# Patient Record
Sex: Male | Born: 1955 | Race: White | Hispanic: No | Marital: Married | Smoking: Never smoker
Health system: Southern US, Community
[De-identification: ages and names within clinical notes are randomized; demographics above are authoritative.]

## PROBLEM LIST (undated history)

## (undated) DIAGNOSIS — I10 Essential (primary) hypertension: Secondary | ICD-10-CM

## (undated) DIAGNOSIS — M1990 Unspecified osteoarthritis, unspecified site: Secondary | ICD-10-CM

## (undated) DIAGNOSIS — E785 Hyperlipidemia, unspecified: Secondary | ICD-10-CM

## (undated) DIAGNOSIS — K219 Gastro-esophageal reflux disease without esophagitis: Secondary | ICD-10-CM

## (undated) DIAGNOSIS — F419 Anxiety disorder, unspecified: Secondary | ICD-10-CM

## (undated) DIAGNOSIS — F32A Depression, unspecified: Secondary | ICD-10-CM

## (undated) DIAGNOSIS — F329 Major depressive disorder, single episode, unspecified: Secondary | ICD-10-CM

## (undated) DIAGNOSIS — G4730 Sleep apnea, unspecified: Secondary | ICD-10-CM

## (undated) DIAGNOSIS — G500 Trigeminal neuralgia: Secondary | ICD-10-CM

## (undated) DIAGNOSIS — C801 Malignant (primary) neoplasm, unspecified: Secondary | ICD-10-CM

## (undated) HISTORY — PX: SURGERY SCROTAL / TESTICULAR: SUR1316

## (undated) HISTORY — PX: COLON SURGERY: SHX602

## (undated) HISTORY — PX: BACK SURGERY: SHX140

## (undated) HISTORY — PX: BRAIN SURGERY: SHX531

## (undated) HISTORY — PX: CERVICAL FUSION: SHX112

---

## 2004-12-25 ENCOUNTER — Emergency Department (HOSPITAL_COMMUNITY): Admission: EM | Admit: 2004-12-25 | Discharge: 2004-12-25 | Payer: Self-pay | Admitting: Emergency Medicine

## 2008-01-08 ENCOUNTER — Emergency Department (HOSPITAL_BASED_OUTPATIENT_CLINIC_OR_DEPARTMENT_OTHER): Admission: EM | Admit: 2008-01-08 | Discharge: 2008-01-08 | Payer: Self-pay | Admitting: Emergency Medicine

## 2008-01-24 ENCOUNTER — Emergency Department (HOSPITAL_BASED_OUTPATIENT_CLINIC_OR_DEPARTMENT_OTHER): Admission: EM | Admit: 2008-01-24 | Discharge: 2008-01-24 | Payer: Self-pay | Admitting: Emergency Medicine

## 2008-06-21 ENCOUNTER — Emergency Department (HOSPITAL_BASED_OUTPATIENT_CLINIC_OR_DEPARTMENT_OTHER): Admission: EM | Admit: 2008-06-21 | Discharge: 2008-06-21 | Payer: Self-pay | Admitting: Emergency Medicine

## 2009-06-18 ENCOUNTER — Emergency Department (HOSPITAL_BASED_OUTPATIENT_CLINIC_OR_DEPARTMENT_OTHER): Admission: EM | Admit: 2009-06-18 | Discharge: 2009-06-18 | Payer: Self-pay | Admitting: Emergency Medicine

## 2009-07-10 ENCOUNTER — Emergency Department (HOSPITAL_BASED_OUTPATIENT_CLINIC_OR_DEPARTMENT_OTHER): Admission: EM | Admit: 2009-07-10 | Discharge: 2009-07-11 | Payer: Self-pay | Admitting: Emergency Medicine

## 2010-08-29 LAB — COMPREHENSIVE METABOLIC PANEL
AST: 26 U/L (ref 0–37)
BUN: 25 mg/dL — ABNORMAL HIGH (ref 6–23)
CO2: 24 mEq/L (ref 19–32)
Calcium: 10.4 mg/dL (ref 8.4–10.5)
Chloride: 97 mEq/L (ref 96–112)
Creatinine, Ser: 1.5 mg/dL (ref 0.4–1.5)
GFR calc Af Amer: 59 mL/min — ABNORMAL LOW (ref >60)
Glucose, Bld: 117 mg/dL — ABNORMAL HIGH (ref 70–99)
Sodium: 138 mEq/L (ref 135–145)

## 2010-08-29 LAB — DIFFERENTIAL
Basophils Absolute: 0.2 10*3/uL — ABNORMAL HIGH (ref 0.0–0.1)
Basophils Relative: 2 % — ABNORMAL HIGH (ref 0–1)
Eosinophils Absolute: 0.1 10*3/uL (ref 0.0–0.7)
Lymphocytes Relative: 4 % — ABNORMAL LOW (ref 12–46)
Monocytes Absolute: 0.5 10*3/uL (ref 0.1–1.0)
Monocytes Relative: 5 % (ref 3–12)

## 2010-08-29 LAB — URINALYSIS, ROUTINE W REFLEX MICROSCOPIC
Ketones, ur: NEGATIVE mg/dL
Leukocytes, UA: NEGATIVE
Protein, ur: >300 mg/dL — AB
Specific Gravity, Urine: 1.015 (ref 1.005–1.030)
Urobilinogen, UA: 0.2 mg/dL (ref 0.0–1.0)

## 2010-08-29 LAB — URINE MICROSCOPIC-ADD ON

## 2010-08-29 LAB — CBC
HCT: 42.3 % (ref 39.0–52.0)
MCHC: 34.6 g/dL (ref 30.0–36.0)
Platelets: 300 10*3/uL (ref 150–400)
RDW: 12.3 % (ref 11.5–15.5)

## 2012-01-07 ENCOUNTER — Encounter (HOSPITAL_BASED_OUTPATIENT_CLINIC_OR_DEPARTMENT_OTHER): Payer: Self-pay | Admitting: *Deleted

## 2012-01-07 ENCOUNTER — Emergency Department (HOSPITAL_BASED_OUTPATIENT_CLINIC_OR_DEPARTMENT_OTHER)
Admission: EM | Admit: 2012-01-07 | Discharge: 2012-01-07 | Disposition: A | Discharge status: Home / Self Care | Payer: Medicare Other | Attending: Emergency Medicine | Admitting: Emergency Medicine

## 2012-01-07 DIAGNOSIS — I10 Essential (primary) hypertension: Secondary | ICD-10-CM | POA: Insufficient documentation

## 2012-01-07 HISTORY — DX: Trigeminal neuralgia: G50.0

## 2012-01-07 NOTE — ED Notes (Signed)
In to d/c pt, pt stated "well i didn't even have to see a doctor"-advised pt that PAs and NPs see pt in the ED and discuss case/care with EDP-pt stated "bet his name will be on the bill"-offered pt to speak with EDP Sheldon-pt refused

## 2012-01-07 NOTE — ED Provider Notes (Signed)
History     CSN: [id number]  Arrival date & time 01/07/12  1725   First MD Initiated Contact with Patient 01/07/12 1739      Chief Complaint  Patient presents with  . Hypertension    (Consider location/radiation/quality/duration/timing/severity/associated sxs/prior treatment) Patient is a 56 y.o. male presenting with hypertension. The history is provided by the patient.  Hypertension This is a new problem. Pertinent negatives include no abdominal pain, chest pain, fever or nausea. Associated symptoms comments: He has had recent major surgery that required a pre-operative cardiac evaluation secondary to high blood pressure. He denies known history of hypertension. He reports cardiac studies, including a cardiolyte stress test were normal, and no bleed pressure medications were prescribed. He is now one month out from surgery and found his blood pressure to be elevated on multiple checks. He was seen this morning at Urgent Care and started on HCTZ. He has had one dose earlier today and presents now because there is no change in blood pressure. No chest pain, SOB, nausea, peripheral edema or visual problems. .    Past Medical History  Diagnosis Date  . Trigeminus neuralgia     History reviewed. No pertinent past surgical history.  History reviewed. No pertinent family history.  History  Substance Use Topics  . Smoking status: Not on file  . Smokeless tobacco: Not on file  . Alcohol Use:       Review of Systems  Constitutional: Negative for fever.  Eyes: Negative for visual disturbance.  Respiratory: Negative for shortness of breath.   Cardiovascular: Negative for chest pain and leg swelling.  Gastrointestinal: Negative for nausea and abdominal pain.    Allergies  Review of patient's allergies indicates no known allergies.  Home Medications   Current Outpatient Rx  Name Route Sig Dispense Refill  . CLONAZEPAM 2 MG PO TABS Oral Take 4 mg by mouth every morning.    Marland Kitchen  CLONIDINE HCL 0.2 MG PO TABS Oral Take 0.2 mg by mouth 2 (two) times daily.    Marland Kitchen HYDROCHLOROTHIAZIDE 25 MG PO TABS Oral Take 25 mg by mouth daily.    . MORPHINE SULFATE ER 60 MG PO CP24 Oral Take 60 mg by mouth 2 (two) times daily.    . VENLAFAXINE HCL ER 150 MG PO CP24 Oral Take 150 mg by mouth daily.      BP 168/98  Pulse 90  Temp 98.5 F (36.9 C) (Oral)  Resp 18  Ht 5\' 7"  (1.702 m)  Wt 210 lb (95.255 kg)  BMI 32.89 kg/m2  SpO2 97%  Physical Exam  Constitutional: He is oriented to person, place, and time. He appears well-developed and well-nourished.  HENT:  Head: Normocephalic.  Neck: Normal range of motion. Neck supple.  Cardiovascular: Normal rate and regular rhythm.   Pulmonary/Chest: Effort normal and breath sounds normal.  Abdominal: Soft. Bowel sounds are normal. There is no tenderness. There is no rebound and no guarding.  Musculoskeletal: Normal range of motion. He exhibits no edema.  Neurological: He is alert and oriented to person, place, and time.  Skin: Skin is warm and dry. No rash noted.  Psychiatric: He has a normal mood and affect.    ED Course  Procedures (including critical care time)  Labs Reviewed - No data to display No results found.   No diagnosis found.  1. Hypertension   MDM  Patient offered reassurance and education regarding treatment of high blood pressure.  Leotis Shames, PA-C 01/07/12 1830

## 2012-01-07 NOTE — ED Notes (Signed)
Pt amb to triage with quick steady gait in nad. Pt reports his blood pressure on his home machine today was 198, seen at an urgent care, rx htcz, pt states "it hasn't done anything to lower my bp yet" pt states he had surgery for trigeminal neuralgia and he is concerned about his pressure being high. Moe + x 4 ext, pearl, speech is clear, grips are = and strong.

## 2012-01-08 NOTE — ED Provider Notes (Signed)
Medical screening examination/treatment/procedure(s) were performed by non-physician practitioner and as supervising physician I was immediately available for consultation/collaboration.   Fiora Weill B. Karle Starch, MD 01/08/12 [phone]

## 2013-04-30 ENCOUNTER — Ambulatory Visit: Payer: Self-pay | Admitting: Pain Medicine

## 2013-04-30 LAB — HEPATIC FUNCTION PANEL A (ARMC)
Alkaline Phosphatase: 52 U/L
Bilirubin, Direct: <0.1 mg/dL (ref 0.00–0.20)

## 2013-04-30 LAB — BASIC METABOLIC PANEL
Anion Gap: 6 — ABNORMAL LOW (ref 7–16)
BUN: 15 mg/dL (ref 7–18)
EGFR (African American): >60
Glucose: 109 mg/dL — ABNORMAL HIGH (ref 65–99)
Osmolality: 279 (ref 275–301)

## 2013-04-30 LAB — MAGNESIUM: Magnesium: 1.9 mg/dL

## 2013-06-17 ENCOUNTER — Ambulatory Visit: Payer: Self-pay | Admitting: Pain Medicine

## 2013-09-17 ENCOUNTER — Other Ambulatory Visit: Payer: Self-pay | Admitting: Anesthesiology

## 2013-10-02 ENCOUNTER — Encounter (HOSPITAL_COMMUNITY): Payer: Self-pay | Admitting: Pharmacy Technician

## 2013-10-08 ENCOUNTER — Encounter (HOSPITAL_COMMUNITY): Payer: Self-pay

## 2013-10-08 ENCOUNTER — Encounter (HOSPITAL_COMMUNITY)
Admission: RE | Admit: 2013-10-08 | Discharge: 2013-10-08 | Disposition: A | Discharge status: Home / Self Care | Payer: Medicare Other | Source: Ambulatory Visit | Attending: Anesthesiology | Admitting: Anesthesiology

## 2013-10-08 DIAGNOSIS — Z01812 Encounter for preprocedural laboratory examination: Secondary | ICD-10-CM | POA: Diagnosis present

## 2013-10-08 HISTORY — DX: Hyperlipidemia, unspecified: E78.5

## 2013-10-08 HISTORY — DX: Sleep apnea, unspecified: G47.30

## 2013-10-08 HISTORY — DX: Gastro-esophageal reflux disease without esophagitis: K21.9

## 2013-10-08 HISTORY — DX: Anxiety disorder, unspecified: F41.9

## 2013-10-08 HISTORY — DX: Malignant (primary) neoplasm, unspecified: C80.1

## 2013-10-08 HISTORY — DX: Unspecified osteoarthritis, unspecified site: M19.90

## 2013-10-08 HISTORY — DX: Essential (primary) hypertension: I10

## 2013-10-08 HISTORY — DX: Major depressive disorder, single episode, unspecified: F32.9

## 2013-10-08 HISTORY — DX: Depression, unspecified: F32.A

## 2013-10-08 LAB — CBC WITH DIFFERENTIAL/PLATELET
Basophils Absolute: 0.1 10*3/uL (ref 0.0–0.1)
Basophils Relative: 1 % (ref 0–1)
Eosinophils Absolute: 0.3 10*3/uL (ref 0.0–0.7)
Eosinophils Relative: 4 % (ref 0–5)
HCT: 44.8 % (ref 39.0–52.0)
Hemoglobin: 15.5 g/dL (ref 13.0–17.0)
Lymphocytes Relative: 23 % (ref 12–46)
Lymphs Abs: 1.8 10*3/uL (ref 0.7–4.0)
MCH: 31.3 pg (ref 26.0–34.0)
MCHC: 34.6 g/dL (ref 30.0–36.0)
MCV: 90.5 fL (ref 78.0–100.0)
Monocytes Absolute: 0.8 10*3/uL (ref 0.1–1.0)
Monocytes Relative: 10 % (ref 3–12)
Neutro Abs: 4.8 10*3/uL (ref 1.7–7.7)
Neutrophils Relative %: 62 % (ref 43–77)
Platelets: 321 10*3/uL (ref 150–400)
RBC: 4.95 MIL/uL (ref 4.22–5.81)
RDW: 13 % (ref 11.5–15.5)
WBC: 7.8 10*3/uL (ref 4.0–10.5)

## 2013-10-08 LAB — BASIC METABOLIC PANEL
BUN: 17 mg/dL (ref 6–23)
CO2: 30 mEq/L (ref 19–32)
Calcium: 9.6 mg/dL (ref 8.4–10.5)
Chloride: 99 mEq/L (ref 96–112)
Creatinine, Ser: 1.39 mg/dL — ABNORMAL HIGH (ref 0.50–1.35)
GFR calc Af Amer: 63 mL/min — ABNORMAL LOW (ref >90)
GFR calc non Af Amer: 54 mL/min — ABNORMAL LOW (ref >90)
Glucose, Bld: 113 mg/dL — ABNORMAL HIGH (ref 70–99)
Potassium: 5 mEq/L (ref 3.7–5.3)
Sodium: 139 mEq/L (ref 137–147)

## 2013-10-08 LAB — URINALYSIS, ROUTINE W REFLEX MICROSCOPIC
Bilirubin Urine: NEGATIVE
Glucose, UA: NEGATIVE mg/dL
Hgb urine dipstick: NEGATIVE
Ketones, ur: NEGATIVE mg/dL
Leukocytes, UA: NEGATIVE
Nitrite: NEGATIVE
Protein, ur: NEGATIVE mg/dL
Specific Gravity, Urine: 1.018 (ref 1.005–1.030)
Urobilinogen, UA: 0.2 mg/dL (ref 0.0–1.0)
pH: 7 (ref 5.0–8.0)

## 2013-10-08 LAB — PROTIME-INR
INR: 0.98 (ref 0.00–1.49)
Prothrombin Time: 12.8 seconds (ref 11.6–15.2)

## 2013-10-08 LAB — APTT: aPTT: 29 seconds (ref 24–37)

## 2013-10-08 NOTE — Progress Notes (Signed)
Requested ov,cxr,ekg from [facility]

## 2013-10-08 NOTE — Pre-Procedure Instructions (Signed)
Joel Hart  10/08/2013   Your procedure is scheduled on:  10-13-2013  Tuesday   Report to [facility] Admitting at 5:30 AM.   Call this number if you have problems the morning of surgery: [phone]   Remember:   Do not eat food or drink liquids after midnight.    Take these medicines the morning of surgery with A SIP OF WATER: clonazepam,fenofibrate,gabapentin,hydrocortisone,effexor   Do not wear jewelry   Do not wear lotions, powders, or perfumes.   Do not shave 48 hours prior to surgery. Men may shave face and neck.  Do not bring valuables to the hospital.  [facility] is not responsible for any belongings or valuables.               Contacts, dentures or bridgework may not be worn into surgery.   Leave suitcase in the car. After surgery it may be brought to your room.   For patients admitted to the hospital, discharge time is determined by your  treatment team.               Patients discharged the day of surgery will not be allowed to drive home.    Special Instructions: See attached sheet for instructions on CHG shower/bath   Please read over the following fact sheets that you were given: Pain Booklet and Surgical Site Infection Prevention

## 2013-10-09 NOTE — Progress Notes (Signed)
Will need CXR and EKG day of surgery. [facility] did not have a CXR and EKG was over a year old.

## 2013-10-12 MED ORDER — CEFAZOLIN SODIUM-DEXTROSE 2-3 GM-% IV SOLR
2.0000 g | INTRAVENOUS | Status: AC
  Administered 2013-10-13: 2 g via INTRAVENOUS
  Filled 2013-10-12: qty 50

## 2013-10-13 ENCOUNTER — Ambulatory Visit (HOSPITAL_COMMUNITY): Payer: Medicare Other

## 2013-10-13 ENCOUNTER — Encounter (HOSPITAL_COMMUNITY)
Admission: RE | Disposition: A | Discharge status: Home / Self Care | Payer: Self-pay | Source: Ambulatory Visit | Attending: Anesthesiology

## 2013-10-13 ENCOUNTER — Ambulatory Visit (HOSPITAL_COMMUNITY)
Admission: RE | Admit: 2013-10-13 | Discharge: 2013-10-13 | Disposition: A | Discharge status: Home / Self Care | Payer: Medicare Other | Source: Ambulatory Visit | Attending: Anesthesiology | Admitting: Anesthesiology

## 2013-10-13 ENCOUNTER — Ambulatory Visit (HOSPITAL_COMMUNITY): Payer: Medicare Other | Admitting: Certified Registered Nurse Anesthetist

## 2013-10-13 ENCOUNTER — Encounter (HOSPITAL_COMMUNITY): Payer: Medicare Other | Admitting: Certified Registered Nurse Anesthetist

## 2013-10-13 ENCOUNTER — Encounter (HOSPITAL_COMMUNITY): Payer: Self-pay | Admitting: *Deleted

## 2013-10-13 DIAGNOSIS — F3289 Other specified depressive episodes: Secondary | ICD-10-CM | POA: Insufficient documentation

## 2013-10-13 DIAGNOSIS — Z8547 Personal history of malignant neoplasm of testis: Secondary | ICD-10-CM | POA: Insufficient documentation

## 2013-10-13 DIAGNOSIS — E785 Hyperlipidemia, unspecified: Secondary | ICD-10-CM | POA: Insufficient documentation

## 2013-10-13 DIAGNOSIS — I509 Heart failure, unspecified: Secondary | ICD-10-CM | POA: Insufficient documentation

## 2013-10-13 DIAGNOSIS — K219 Gastro-esophageal reflux disease without esophagitis: Secondary | ICD-10-CM | POA: Insufficient documentation

## 2013-10-13 DIAGNOSIS — G500 Trigeminal neuralgia: Secondary | ICD-10-CM | POA: Insufficient documentation

## 2013-10-13 DIAGNOSIS — F329 Major depressive disorder, single episode, unspecified: Secondary | ICD-10-CM | POA: Insufficient documentation

## 2013-10-13 DIAGNOSIS — IMO0002 Reserved for concepts with insufficient information to code with codable children: Secondary | ICD-10-CM | POA: Insufficient documentation

## 2013-10-13 DIAGNOSIS — I10 Essential (primary) hypertension: Secondary | ICD-10-CM | POA: Insufficient documentation

## 2013-10-13 DIAGNOSIS — F411 Generalized anxiety disorder: Secondary | ICD-10-CM | POA: Insufficient documentation

## 2013-10-13 DIAGNOSIS — E039 Hypothyroidism, unspecified: Secondary | ICD-10-CM | POA: Insufficient documentation

## 2013-10-13 DIAGNOSIS — I252 Old myocardial infarction: Secondary | ICD-10-CM | POA: Insufficient documentation

## 2013-10-13 DIAGNOSIS — G4730 Sleep apnea, unspecified: Secondary | ICD-10-CM | POA: Insufficient documentation

## 2013-10-13 DIAGNOSIS — Z981 Arthrodesis status: Secondary | ICD-10-CM | POA: Insufficient documentation

## 2013-10-13 DIAGNOSIS — M129 Arthropathy, unspecified: Secondary | ICD-10-CM | POA: Insufficient documentation

## 2013-10-13 HISTORY — PX: SPINAL CORD STIMULATOR INSERTION: SHX5378

## 2013-10-13 SURGERY — INSERTION, SPINAL CORD STIMULATOR, LUMBAR
Anesthesia: General

## 2013-10-13 MED ORDER — ACETAMINOPHEN 160 MG/5ML PO SOLN
325.0000 mg | ORAL | Status: DC | PRN
  Filled 2013-10-13: qty 20.3

## 2013-10-13 MED ORDER — GLYCOPYRROLATE 0.2 MG/ML IJ SOLN
INTRAMUSCULAR | Status: DC | PRN
  Administered 2013-10-13: 0.2 mg via INTRAVENOUS
  Administered 2013-10-13: 1 mg via INTRAVENOUS

## 2013-10-13 MED ORDER — NEOSTIGMINE METHYLSULFATE 10 MG/10ML IV SOLN
INTRAVENOUS | Status: DC | PRN
  Administered 2013-10-13: 5 mg via INTRAVENOUS

## 2013-10-13 MED ORDER — MIDAZOLAM HCL 5 MG/5ML IJ SOLN
INTRAMUSCULAR | Status: DC | PRN
Start: 2013-10-13 — End: 2013-10-13
  Administered 2013-10-13: 2 mg via INTRAVENOUS

## 2013-10-13 MED ORDER — ACETAMINOPHEN 325 MG PO TABS: 325.0000 mg | ORAL_TABLET | ORAL | Status: DC | PRN

## 2013-10-13 MED ORDER — ROCURONIUM BROMIDE 50 MG/5ML IV SOLN
INTRAVENOUS | Status: AC
  Filled 2013-10-13: qty 1

## 2013-10-13 MED ORDER — OXYCODONE HCL 5 MG PO TABS
5.0000 mg | ORAL_TABLET | Freq: Once | ORAL | Status: AC | PRN
  Administered 2013-10-13: 5 mg via ORAL

## 2013-10-13 MED ORDER — ARTIFICIAL TEARS OP OINT
TOPICAL_OINTMENT | OPHTHALMIC | Status: DC | PRN
  Administered 2013-10-13: 1 via OPHTHALMIC

## 2013-10-13 MED ORDER — BUPIVACAINE-EPINEPHRINE (PF) 0.5% -1:200000 IJ SOLN
INTRAMUSCULAR | Status: DC | PRN
  Administered 2013-10-13: 10 mL

## 2013-10-13 MED ORDER — ROCURONIUM BROMIDE 100 MG/10ML IV SOLN
INTRAVENOUS | Status: DC | PRN
Start: 2013-10-13 — End: 2013-10-13
  Administered 2013-10-13: 50 mg via INTRAVENOUS

## 2013-10-13 MED ORDER — CEPHALEXIN 500 MG PO CAPS: 500.0000 mg | ORAL_CAPSULE | Freq: Three times a day (TID) | ORAL | Status: DC

## 2013-10-13 MED ORDER — LIDOCAINE HCL (CARDIAC) 20 MG/ML IV SOLN
INTRAVENOUS | Status: DC | PRN
  Administered 2013-10-13: 80 mg via INTRAVENOUS

## 2013-10-13 MED ORDER — FENTANYL CITRATE 0.05 MG/ML IJ SOLN
25.0000 ug | INTRAMUSCULAR | Status: DC | PRN
  Administered 2013-10-13: 50 ug via INTRAVENOUS

## 2013-10-13 MED ORDER — FENTANYL CITRATE 0.05 MG/ML IJ SOLN
INTRAMUSCULAR | Status: AC
  Filled 2013-10-13: qty 2

## 2013-10-13 MED ORDER — FENTANYL CITRATE 0.05 MG/ML IJ SOLN
INTRAMUSCULAR | Status: AC
  Filled 2013-10-13: qty 5

## 2013-10-13 MED ORDER — ONDANSETRON HCL 4 MG/2ML IJ SOLN
INTRAMUSCULAR | Status: DC | PRN
  Administered 2013-10-13: 4 mg via INTRAVENOUS

## 2013-10-13 MED ORDER — ONDANSETRON HCL 4 MG/2ML IJ SOLN
INTRAMUSCULAR | Status: AC
  Filled 2013-10-13: qty 2

## 2013-10-13 MED ORDER — GLYCOPYRROLATE 0.2 MG/ML IJ SOLN
INTRAMUSCULAR | Status: AC
  Filled 2013-10-13: qty 5

## 2013-10-13 MED ORDER — 0.9 % SODIUM CHLORIDE (POUR BTL) OPTIME
TOPICAL | Status: DC | PRN
  Administered 2013-10-13: 1000 mL

## 2013-10-13 MED ORDER — PHENYLEPHRINE 40 MCG/ML (10ML) SYRINGE FOR IV PUSH (FOR BLOOD PRESSURE SUPPORT)
PREFILLED_SYRINGE | INTRAVENOUS | Status: AC
  Filled 2013-10-13: qty 10

## 2013-10-13 MED ORDER — LIDOCAINE HCL (CARDIAC) 20 MG/ML IV SOLN
INTRAVENOUS | Status: AC
  Filled 2013-10-13: qty 5

## 2013-10-13 MED ORDER — OXYCODONE HCL 5 MG PO TABS
ORAL_TABLET | ORAL | Status: DC
Start: 2013-10-13 — End: 2013-10-13
  Filled 2013-10-13: qty 1

## 2013-10-13 MED ORDER — STERILE WATER FOR INJECTION IJ SOLN
INTRAMUSCULAR | Status: AC
  Filled 2013-10-13: qty 10

## 2013-10-13 MED ORDER — PROPOFOL 10 MG/ML IV BOLUS
INTRAVENOUS | Status: DC | PRN
  Administered 2013-10-13: 160 mg via INTRAVENOUS
  Administered 2013-10-13: 40 mg via INTRAVENOUS

## 2013-10-13 MED ORDER — OXYCODONE-ACETAMINOPHEN 10-325 MG PO TABS: 1.0000 | ORAL_TABLET | ORAL | Status: DC | PRN

## 2013-10-13 MED ORDER — ARTIFICIAL TEARS OP OINT
TOPICAL_OINTMENT | OPHTHALMIC | Status: AC
  Filled 2013-10-13: qty 3.5

## 2013-10-13 MED ORDER — NEOSTIGMINE METHYLSULFATE 10 MG/10ML IV SOLN
INTRAVENOUS | Status: AC
  Filled 2013-10-13: qty 1

## 2013-10-13 MED ORDER — SODIUM CHLORIDE 0.9 % IR SOLN
Status: DC | PRN
  Administered 2013-10-13: 09:00:00

## 2013-10-13 MED ORDER — GLYCOPYRROLATE 0.2 MG/ML IJ SOLN
INTRAMUSCULAR | Status: AC
  Filled 2013-10-13: qty 2

## 2013-10-13 MED ORDER — PROMETHAZINE HCL 25 MG/ML IJ SOLN: 6.2500 mg | INTRAMUSCULAR | Status: DC | PRN

## 2013-10-13 MED ORDER — LACTATED RINGERS IV SOLN
INTRAVENOUS | Status: DC | PRN
  Administered 2013-10-13 (×2): via INTRAVENOUS

## 2013-10-13 MED ORDER — MIDAZOLAM HCL 2 MG/2ML IJ SOLN
INTRAMUSCULAR | Status: AC
  Filled 2013-10-13: qty 2

## 2013-10-13 MED ORDER — EPHEDRINE SULFATE 50 MG/ML IJ SOLN
INTRAMUSCULAR | Status: AC
  Filled 2013-10-13: qty 1

## 2013-10-13 MED ORDER — PROPOFOL 10 MG/ML IV BOLUS
INTRAVENOUS | Status: AC
  Filled 2013-10-13: qty 20

## 2013-10-13 MED ORDER — FENTANYL CITRATE 0.05 MG/ML IJ SOLN
INTRAMUSCULAR | Status: DC | PRN
  Administered 2013-10-13: 150 ug via INTRAVENOUS
  Administered 2013-10-13: 100 ug via INTRAVENOUS

## 2013-10-13 MED ORDER — OXYCODONE HCL 5 MG/5ML PO SOLN: 5.0000 mg | Freq: Once | ORAL | Status: AC | PRN

## 2013-10-13 MED ORDER — BACITRACIN-NEOMYCIN-POLYMYXIN OINTMENT TUBE
TOPICAL_OINTMENT | CUTANEOUS | Status: DC | PRN
  Administered 2013-10-13: 1 via TOPICAL

## 2013-10-13 SURGICAL SUPPLY — 76 items
ADH SKN CLS APL DERMABOND .7 (GAUZE/BANDAGES/DRESSINGS) ×1
APL SKNCLS STERI-STRIP NONHPOA (GAUZE/BANDAGES/DRESSINGS)
BAG DECANTER FOR FLEXI CONT (MISCELLANEOUS) ×2 IMPLANT
BENZOIN TINCTURE PRP APPL 2/3 (GAUZE/BANDAGES/DRESSINGS) IMPLANT
BINDER ABDOMINAL 12 ML 46-62 (SOFTGOODS) ×2 IMPLANT
BLADE 10 SAFETY STRL DISP (BLADE) ×2 IMPLANT
BLADE SURG ROTATE 9660 (MISCELLANEOUS) IMPLANT
CHLORAPREP W/TINT 26ML (MISCELLANEOUS) ×3 IMPLANT
CONT SPEC 4OZ CLIKSEAL STRL BL (MISCELLANEOUS) ×2 IMPLANT
DERMABOND ADVANCED (GAUZE/BANDAGES/DRESSINGS) ×1
DERMABOND ADVANCED .7 DNX12 (GAUZE/BANDAGES/DRESSINGS) ×1 IMPLANT
DRAPE C-ARM 42X72 X-RAY (DRAPES) ×3 IMPLANT
DRAPE C-ARMOR (DRAPES) ×2 IMPLANT
DRAPE INCISE IOBAN 66X45 STRL (DRAPES) ×2 IMPLANT
DRAPE LAPAROTOMY 100X72X124 (DRAPES) ×2 IMPLANT
DRAPE POUCH INSTRU U-SHP 10X18 (DRAPES) ×2 IMPLANT
DRAPE SURG 17X23 STRL (DRAPES) ×2 IMPLANT
DRAPE U-SHAPE 47X51 STRL (DRAPES) ×1 IMPLANT
DRESSING TELFA 8X3 (GAUZE/BANDAGES/DRESSINGS) IMPLANT
DRSG OPSITE POSTOP 3X4 (GAUZE/BANDAGES/DRESSINGS) ×2 IMPLANT
DRSG OPSITE POSTOP 4X6 (GAUZE/BANDAGES/DRESSINGS) IMPLANT
DURAPREP 26ML APPLICATOR (WOUND CARE) ×2 IMPLANT
ELECT REM PT RETURN 9FT ADLT (ELECTROSURGICAL) ×2
ELECTRODE REM PT RTRN 9FT ADLT (ELECTROSURGICAL) ×1 IMPLANT
EXTENSION 35CM (Spinal Cord Stimulator) ×1 IMPLANT
GAUZE SPONGE 4X4 16PLY XRAY LF (GAUZE/BANDAGES/DRESSINGS) ×2 IMPLANT
GLOVE BIO SURGEON STRL SZ8 (GLOVE) ×3 IMPLANT
GLOVE BIOGEL PI IND STRL 7.5 (GLOVE) ×1 IMPLANT
GLOVE BIOGEL PI IND STRL 8 (GLOVE) IMPLANT
GLOVE BIOGEL PI INDICATOR 7.5 (GLOVE) ×1
GLOVE BIOGEL PI INDICATOR 8 (GLOVE) ×2
GLOVE ECLIPSE 7.5 STRL STRAW (GLOVE) ×2 IMPLANT
GLOVE EXAM NITRILE LRG STRL (GLOVE) IMPLANT
GLOVE EXAM NITRILE MD LF STRL (GLOVE) IMPLANT
GLOVE EXAM NITRILE XL STR (GLOVE) IMPLANT
GLOVE EXAM NITRILE XS STR PU (GLOVE) IMPLANT
GOWN STRL REUS W/ TWL LRG LVL3 (GOWN DISPOSABLE) IMPLANT
GOWN STRL REUS W/ TWL XL LVL3 (GOWN DISPOSABLE) IMPLANT
GOWN STRL REUS W/TWL 2XL LVL3 (GOWN DISPOSABLE) ×2 IMPLANT
GOWN STRL REUS W/TWL LRG LVL3 (GOWN DISPOSABLE) ×8
GOWN STRL REUS W/TWL XL LVL3 (GOWN DISPOSABLE) ×4
IPG PRECISION SPECTRA (Stimulator) ×1 IMPLANT
KIT BASIN OR (CUSTOM PROCEDURE TRAY) ×2 IMPLANT
KIT CHARGING (KITS) ×2
KIT CHARGING PRECISION NEURO (KITS) IMPLANT
KIT LEAD 50CM (Lead) ×1 IMPLANT
KIT REMOTE CONTROL PRECISION (KITS) ×2 IMPLANT
KIT ROOM TURNOVER OR (KITS) ×2 IMPLANT
NDL 18GX1X1/2 (RX/OR ONLY) (NEEDLE) IMPLANT
NDL EPIMED (NEEDLE) IMPLANT
NDL HYPO 25X1 1.5 SAFETY (NEEDLE) ×1 IMPLANT
NEEDLE 18GX1X1/2 (RX/OR ONLY) (NEEDLE) IMPLANT
NEEDLE EPIMED (NEEDLE) ×4 IMPLANT
NEEDLE HYPO 25X1 1.5 SAFETY (NEEDLE) ×2 IMPLANT
NS IRRIG 1000ML POUR BTL (IV SOLUTION) ×2 IMPLANT
PACK LAMINECTOMY NEURO (CUSTOM PROCEDURE TRAY) ×2 IMPLANT
PAD ARMBOARD 7.5X6 YLW CONV (MISCELLANEOUS) ×2 IMPLANT
SCREW SELF DRILL HT 1.5/4MM (Screw) ×1 IMPLANT
SHEATH PERITONEAL INTRO 61 (MISCELLANEOUS) ×1 IMPLANT
SPONGE LAP 4X18 X RAY DECT (DISPOSABLE) ×2 IMPLANT
SPONGE SURGIFOAM ABS GEL SZ50 (HEMOSTASIS) IMPLANT
STAPLER SKIN PROX WIDE 3.9 (STAPLE) ×2 IMPLANT
STRIP CLOSURE SKIN 1/2X4 (GAUZE/BANDAGES/DRESSINGS) IMPLANT
SUT MNCRL AB 4-0 PS2 18 (SUTURE) IMPLANT
SUT SILK 0 (SUTURE) ×2
SUT SILK 0 MO-6 18XCR BRD 8 (SUTURE) ×1 IMPLANT
SUT SILK 0 TIES 10X30 (SUTURE) ×1 IMPLANT
SUT SILK 2 0 FS (SUTURE) ×1 IMPLANT
SUT SILK 2 0 TIES 10X30 (SUTURE) IMPLANT
SUT VIC AB 2-0 CP2 18 (SUTURE) ×3 IMPLANT
SYRINGE 10CC LL (SYRINGE) IMPLANT
TOOL LONG TUNNEL (SPINAL CORD STIMULATOR) ×4 IMPLANT
TOWEL OR 17X24 6PK STRL BLUE (TOWEL DISPOSABLE) ×2 IMPLANT
TOWEL OR 17X26 10 PK STRL BLUE (TOWEL DISPOSABLE) ×2 IMPLANT
WATER STERILE IRR 1000ML POUR (IV SOLUTION) ×2 IMPLANT
YANKAUER SUCT BULB TIP NO VENT (SUCTIONS) ×2 IMPLANT

## 2013-10-13 NOTE — Transfer of Care (Signed)
Immediate Anesthesia Transfer of Care Note  Patient: Joel Hart  Procedure(s) Performed: Procedure(s): Supraorbital Peripheral nerve stimulator with Dr. Vertell Limber assisting (N/A)  Patient Location: PACU  Anesthesia Type:General  Level of Consciousness: awake, alert  and oriented  Airway & Oxygen Therapy: Patient Spontanous Breathing and Patient connected to face mask oxygen  Post-op Assessment: Report given to PACU RN, Post -op Vital signs reviewed and stable and Patient moving all extremities X 4  Post vital signs: Reviewed and stable  Complications: No apparent anesthesia complications

## 2013-10-13 NOTE — H&P (Signed)
Joel Hart is an 58 y.o. male.   Chief Complaint: trigeminal neuralgia HPI: On 09/10/2013, we placed a left-sided supraorbital stimulator lead to try to manage the patient's atypical facial pain/trigeminal neuralgia.  Please see my previous office notes describing this patient's substantial medical history associated with this issue.  Briefly he has undergone medication management, various interventions, decompressive surgery, all to no avail.  He interestingly had extremely short term but profound results from supraorbital blockade.  He was referred to me specifically for this procedure.  Over the course of the last 5 days, the patient reports having had profound pain relief, up to 85%.  Whereas he used to have extreme photophobia, and wear sunglasses even inside his house, he has been able to go outside without protective eyewear.  He reports even forgetting to wear sunglasses outside.  He has been happy, and active.  He is extremely pleased with his outcome.  He notes that one of his programs in particular gave him some relief of searing nerve pain that radiated up towards his forehead from the infraorbital trochlear fossa, and we have good coverage from other programs out across the lateral aspect of his forehead and down along his nasal bridge.  Today with the assistance of the [employer] we have done some program blending, and the patient reports substantive relief in all areas with the blended program.  Again he seems even more pleased with this outcome.  Patient denies fevers, chills, redness, pain, bleeding or drainage from his placement site.  Past Medical History  Diagnosis Date  . Trigeminus neuralgia   . Hypertension   . Sleep apnea     uses CPAP  . Depression   . Anxiety   . GERD (gastroesophageal reflux disease)   . Arthritis   . Cancer     testicular  . Hyperlipidemia     Past Surgical History  Procedure Laterality Date  . Back surgery    . Cervical  fusion    . Surgery scrotal / testicular    . Brain surgery      times 2  . Colon surgery      from parasite     History reviewed. No pertinent family history. Social History:  reports that he has never smoked. He does not have any smokeless tobacco history on file. He reports that he drinks alcohol. He reports that he uses illicit drugs (Marijuana).  Allergies: No Known Allergies  Medications Prior to Admission  Medication Sig Dispense Refill  . clonazePAM (KLONOPIN) 2 MG tablet Take 6 mg by mouth daily.      . fenofibrate micronized (LOFIBRA) 200 MG capsule Take 200 mg by mouth daily.      Marland Kitchen gabapentin (NEURONTIN) 600 MG tablet Take 1,800 mg by mouth daily.      . hydrocortisone (CORTEF) 20 MG tablet Take 20 mg by mouth daily.      Marland Kitchen venlafaxine XR (EFFEXOR-XR) 150 MG 24 hr capsule Take 150 mg by mouth daily.      . verapamil (COVERA HS) 240 MG (CO) 24 hr tablet Take 240 mg by mouth at bedtime.        No results found for this or any previous visit (from the past 48 hour(s)). Dg Chest 2 View  10/13/2013   CLINICAL DATA:  Preop for neurologic stimulator  EXAM: CHEST  2 VIEW  COMPARISON:  05/26/2013  FINDINGS: No cardiomegaly or mediastinal contour abnormality. No edema, consolidation, effusion, or pneumothorax. Lower cervical anterior discectomy  with fusion. Numerous surgical clips in the right upper quadrant and epigastrium.  IMPRESSION: No active cardiopulmonary disease.   Electronically Signed   By: Jorje Guild M.D.   On: 10/13/2013 06:40    Review of Systems  Constitutional: Negative.   Eyes: Negative.   Respiratory: Negative.   Cardiovascular: Negative.   Gastrointestinal: Negative.   Genitourinary: Negative.   Musculoskeletal: Positive for neck pain.  Skin: Negative.   Neurological: Negative.   Endo/Heme/Allergies: Negative.   Psychiatric/Behavioral: Negative.     Blood pressure 125/62, pulse 63, temperature 98.6 F (37 C), temperature source Oral, resp. rate 20,  weight 105.688 kg (233 lb), SpO2 98.00%. Physical Exam  Constitutional: He appears well-developed and well-nourished.  HENT:  Head: Normocephalic and atraumatic.  Eyes: Conjunctivae are normal.  Neck: Normal range of motion.  Cardiovascular: Normal rate and regular rhythm.   Respiratory: Effort normal and breath sounds normal.  Musculoskeletal: Normal range of motion.  Skin: Skin is warm and dry.  Psychiatric: He has a normal mood and affect. His behavior is normal. Judgment and thought content normal.     Assessment/Plan A) trigeminal neuralgia with components of atypical facial pain, photophobia P) permanent nerve/SCS placement given profoundly good results  Bonna Gains 10/13/2013, 7:23 AM

## 2013-10-13 NOTE — Anesthesia Postprocedure Evaluation (Signed)
  Anesthesia Post-op Note  Patient: Joel Hart  Procedure(s) Performed: Procedure(s): Supraorbital Peripheral nerve stimulator with Dr. Vertell Limber assisting (N/A)  Patient Location: PACU  Anesthesia Type:General  Level of Consciousness: awake and alert   Airway and Oxygen Therapy: Patient Spontanous Breathing  Post-op Pain: mild  Post-op Assessment: Post-op Vital signs reviewed, Patient's Cardiovascular Status Stable, Respiratory Function Stable, Patent Airway, No signs of Nausea or vomiting and Pain level controlled  Post-op Vital Signs: Reviewed and stable  Last Vitals:  Filed Vitals:   10/13/13 1153  BP: 163/67  Pulse: 52  Temp:   Resp: 15    Complications: No apparent anesthesia complications

## 2013-10-13 NOTE — Anesthesia Preprocedure Evaluation (Addendum)
Anesthesia Evaluation  Patient identified by MRN, date of birth, ID band Patient awake    Reviewed: Allergy & Precautions, H&P , NPO status , Patient's Chart, lab work & pertinent test results  History of Anesthesia Complications Negative for: history of anesthetic complications  Airway Mallampati: III TM Distance: >3 FB Neck ROM: Full    Dental  (+) Teeth Intact   Pulmonary neg shortness of breath, sleep apnea , neg COPDneg recent URI, neg PE breath sounds clear to auscultation        Cardiovascular hypertension, Pt. on medications - angina- Past MI and - CHF - dysrhythmias - Valvular Problems/MurmursRhythm:Regular     Neuro/Psych PSYCHIATRIC DISORDERS Anxiety Depression Left trigeminal and occipital neuralgia  Neuromuscular disease    GI/Hepatic negative GI ROS, Neg liver ROS,   Endo/Other  Chronic steroids, possible hypothyroidism per patient  Renal/GU Renal InsufficiencyRenal disease     Musculoskeletal   Abdominal   Peds  Hematology negative hematology ROS (+)   Anesthesia Other Findings   Reproductive/Obstetrics                          Anesthesia Physical Anesthesia Plan  ASA: III  Anesthesia Plan: General   Post-op Pain Management:    Induction: Intravenous  Airway Management Planned: Oral ETT  Additional Equipment: None  Intra-op Plan:   Post-operative Plan: Extubation in OR  Informed Consent: I have reviewed the patients History and Physical, chart, labs and discussed the procedure including the risks, benefits and alternatives for the proposed anesthesia with the patient or authorized representative who has indicated his/her understanding and acceptance.   Dental advisory given  Plan Discussed with: CRNA and Surgeon  Anesthesia Plan Comments:        Anesthesia Quick Evaluation

## 2013-10-13 NOTE — Anesthesia Procedure Notes (Signed)
Procedure Name: Intubation Date/Time: 10/13/2013 7:43 AM Performed by: Ollen Bowl Pre-anesthesia Checklist: Patient identified, Emergency Drugs available, Suction available, Patient being monitored and Timeout performed Patient Re-evaluated:Patient Re-evaluated prior to inductionOxygen Delivery Method: Circle system utilized and Simple face mask Preoxygenation: Pre-oxygenation with 100% oxygen Intubation Type: IV induction Ventilation: Mask ventilation without difficulty Laryngoscope Size: Miller and 3 Grade View: Grade I Tube type: Oral Tube size: 7.5 mm Number of attempts: 1 Airway Equipment and Method: Patient positioned with wedge pillow and Stylet Placement Confirmation: ETT inserted through vocal cords under direct vision,  positive ETCO2 and breath sounds checked- equal and bilateral Secured at: 23 cm Tube secured with: Tape Dental Injury: Teeth and Oropharynx as per pre-operative assessment

## 2013-10-13 NOTE — Op Note (Signed)
PREOP DX: left trigeminal neuralgia POSTOP DX: same as preop  PROCEDURES PERFORMED:  1) implantation of 1 70 cm 8 contact [place] leads  2) implantation of [employer] Spectra IPG  3) I/O fluoro with interpretation  SURGEON: Callee Rohrig  ASSISTANT: Stern ANESTHESIA: GETA EBL:46mL  DESCRIPTION OF PROCEDURE: After a discussion of risks, benefits and alternatives, written informed consent was obtained. The patient was taken to the operative suite where, after an adequate plane of general endotracheal anesthesia was obtained, placed on the table in the prone position with chest rolls,  left side propped up approximately 30 degrees. The eyes were carefully protected (left eye with waterproof dressings),  head/face cushioned, and extreme care taken to pad all pressure points and keep arms neutral . A timeout was taken, identifying the patient, procedure, personnel, equipment, antibiotic administration and staff concerns (of which there were none).  The left skull, neck, cervical, thoracic and lumbar spine were shaved, and widely prepped with duraprep, and draped into a sterile field. Fluoroscopy was utilized to plan a  placement of leads into the left supraorbital position. The skin and subcutaneous tissue were infiltrated with 0.25% bupivicaine 1:200K epinephrine, and Dr. Vertell Limber made an incision superior to the temporalis muscle, and carried down to the bony surface.  14g Touhy needle were used to access the intended space using intermittent fluoroscopic guidance.The  lead rested just superior to the orbit, with the distal contact just medial to the midline.   With good position established, the needles and stylets were backed off the lead, with fluoro verifying no migration of the lead from its position. A silastic anchor was placed, and 2 1.5 x50mm bone plate screws used to fix the lead to the skull.  .  Attention was turned to creation of a subcutaneous pocket in the patients left flank. An  incision was planned, the skin and subcutaneous tissues anesthetized with 0.25% bupivicaine 1:200K epinephrine, and then an incision made and a pocket developed using blunt dissection and the Bovie electrocautery. Hemostasis was assured in the pocket, and then the lead, with a 35cm extension passed into the pocket using a reverse Seldinger technique. To adequately and safely negotiate the patient's previous skull surgery, tunnelling required 2 intermediate steps/incisions.    The lead was meticulously cleaned, and inserted into the generator. Impedances were checked and found to be good in each contact; the lead was then fixed into the generator using a self torquing wrench. Impedances were rechecked and found to be good in each contact.  Each incision was copiously irrigated with  bacitracin-containing irrigant. The incisions was closed with  layers of interrupted 2-0 vicryl sutures and the skin closed with staples.  Sterile dressings were applied and the patient taken to the PACU. Needle, instrument and sponge counts were correct x2 at the end of the case.  COMPLICATIONS: NONE  CONDITION: stable throughout the course of the procedure and immediately afterward to PACU  DISPOSITION:  Discharge home. Pain medicine and prophylactic antibiotics prescribed.  May remove dressings and shower on post-op day #4. No submerging. Pts spouse has medical training and knows to call for signs/symptoms of infection or any other problem or concern.

## 2013-10-13 NOTE — Discharge Instructions (Signed)

## 2013-10-16 ENCOUNTER — Encounter (HOSPITAL_COMMUNITY): Payer: Self-pay | Admitting: Anesthesiology

## 2015-01-21 ENCOUNTER — Emergency Department (HOSPITAL_COMMUNITY): Payer: No Typology Code available for payment source

## 2015-01-21 ENCOUNTER — Emergency Department (HOSPITAL_COMMUNITY)
Admission: EM | Admit: 2015-01-21 | Discharge: 2015-01-21 | Disposition: A | Discharge status: Home / Self Care | Payer: No Typology Code available for payment source | Attending: Emergency Medicine | Admitting: Emergency Medicine

## 2015-01-21 DIAGNOSIS — Z79899 Other long term (current) drug therapy: Secondary | ICD-10-CM | POA: Insufficient documentation

## 2015-01-21 DIAGNOSIS — S3992XA Unspecified injury of lower back, initial encounter: Secondary | ICD-10-CM | POA: Insufficient documentation

## 2015-01-21 DIAGNOSIS — R200 Anesthesia of skin: Secondary | ICD-10-CM | POA: Diagnosis not present

## 2015-01-21 DIAGNOSIS — M1990 Unspecified osteoarthritis, unspecified site: Secondary | ICD-10-CM | POA: Diagnosis not present

## 2015-01-21 DIAGNOSIS — Z8639 Personal history of other endocrine, nutritional and metabolic disease: Secondary | ICD-10-CM | POA: Insufficient documentation

## 2015-01-21 DIAGNOSIS — Y998 Other external cause status: Secondary | ICD-10-CM | POA: Diagnosis not present

## 2015-01-21 DIAGNOSIS — Y9389 Activity, other specified: Secondary | ICD-10-CM | POA: Insufficient documentation

## 2015-01-21 DIAGNOSIS — Z8547 Personal history of malignant neoplasm of testis: Secondary | ICD-10-CM | POA: Diagnosis not present

## 2015-01-21 DIAGNOSIS — S199XXA Unspecified injury of neck, initial encounter: Secondary | ICD-10-CM | POA: Insufficient documentation

## 2015-01-21 DIAGNOSIS — F419 Anxiety disorder, unspecified: Secondary | ICD-10-CM | POA: Insufficient documentation

## 2015-01-21 DIAGNOSIS — S0990XA Unspecified injury of head, initial encounter: Secondary | ICD-10-CM | POA: Diagnosis not present

## 2015-01-21 DIAGNOSIS — Z8719 Personal history of other diseases of the digestive system: Secondary | ICD-10-CM | POA: Insufficient documentation

## 2015-01-21 DIAGNOSIS — G500 Trigeminal neuralgia: Secondary | ICD-10-CM | POA: Insufficient documentation

## 2015-01-21 DIAGNOSIS — Z792 Long term (current) use of antibiotics: Secondary | ICD-10-CM | POA: Insufficient documentation

## 2015-01-21 DIAGNOSIS — I10 Essential (primary) hypertension: Secondary | ICD-10-CM | POA: Diagnosis not present

## 2015-01-21 DIAGNOSIS — H53149 Visual discomfort, unspecified: Secondary | ICD-10-CM | POA: Diagnosis not present

## 2015-01-21 DIAGNOSIS — G4730 Sleep apnea, unspecified: Secondary | ICD-10-CM | POA: Diagnosis not present

## 2015-01-21 DIAGNOSIS — F329 Major depressive disorder, single episode, unspecified: Secondary | ICD-10-CM | POA: Insufficient documentation

## 2015-01-21 DIAGNOSIS — Y92410 Unspecified street and highway as the place of occurrence of the external cause: Secondary | ICD-10-CM | POA: Insufficient documentation

## 2015-01-21 DIAGNOSIS — S4992XA Unspecified injury of left shoulder and upper arm, initial encounter: Secondary | ICD-10-CM | POA: Insufficient documentation

## 2015-01-21 DIAGNOSIS — M542 Cervicalgia: Secondary | ICD-10-CM

## 2015-01-21 MED ORDER — ONDANSETRON HCL 4 MG/2ML IJ SOLN
4.0000 mg | Freq: Once | INTRAMUSCULAR | Status: AC
  Administered 2015-01-21: 4 mg via INTRAVENOUS
  Filled 2015-01-21: qty 2
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
## 2015-01-21 NOTE — Discharge Instructions (Signed)
-   Call your neurologist to schedule a follow up appointment - Return to ED with severe headache, neck pain, worsening or new weakness/numbness/tingling in extremities, loss of bowel/bladder control, nausea, vomiting or further worsening of symptoms
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
## 2015-08-05 IMAGING — RF DG C-ARM 61-120 MIN
1 series · 1 of 1 positions shown · non-contrast
Comparison: None

CLINICAL DATA: Stimulator placement

EXAM:
DG C-ARM 61-120 MIN; ORBITS - 1 VIEW

[Series 1: run · 1 of 1 slices shown]
[im 1/1]
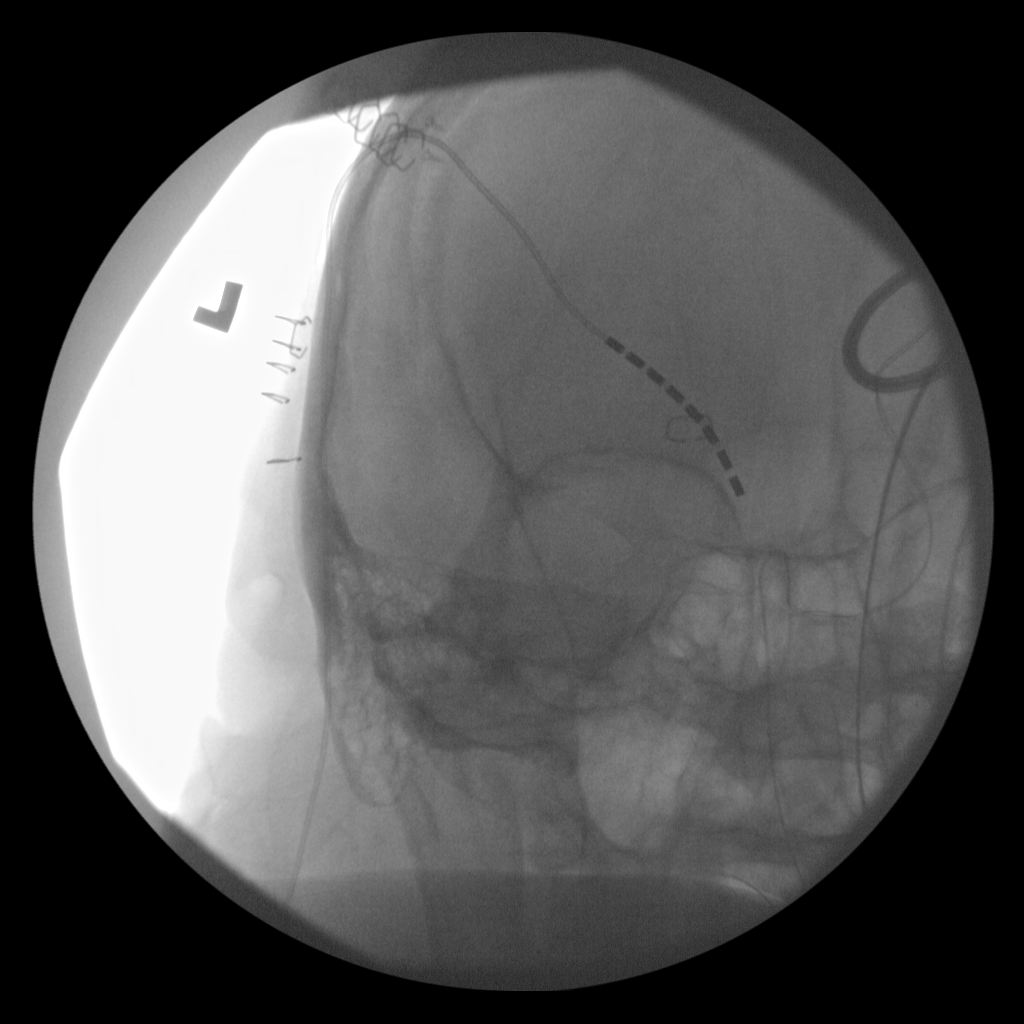

[1 of 1 positions shown; findings below may reference images not displayed]

FINDINGS: A stimulator is seen with its tip medial to the superior most aspect
of the left orbital rim. On this single view, no fracture or
dislocation is appreciable. Visualized paranasal sinuses appear
clear.
IMPRESSION: Tip of stimulator is medial to the superiorleft orbital rim. No bony
abnormality appreciable on this limited study.
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
## 2019-02-23 ENCOUNTER — Ambulatory Visit: Payer: Self-pay

## 2019-03-02 ENCOUNTER — Other Ambulatory Visit: Payer: Self-pay

## 2019-03-02 ENCOUNTER — Encounter: Payer: Self-pay | Admitting: Family Medicine

## 2019-03-02 ENCOUNTER — Ambulatory Visit (INDEPENDENT_AMBULATORY_CARE_PROVIDER_SITE_OTHER): Payer: Medicare Other | Admitting: Family Medicine

## 2019-03-02 VITALS — BP 130/70 | HR 77 | Temp 97.3°F | Ht 66.0 in | Wt 204.0 lb

## 2019-03-02 DIAGNOSIS — M5481 Occipital neuralgia: Secondary | ICD-10-CM | POA: Insufficient documentation

## 2019-03-02 DIAGNOSIS — G500 Trigeminal neuralgia: Secondary | ICD-10-CM | POA: Diagnosis not present

## 2019-03-02 DIAGNOSIS — G894 Chronic pain syndrome: Secondary | ICD-10-CM

## 2019-03-02 DIAGNOSIS — Z23 Encounter for immunization: Secondary | ICD-10-CM | POA: Diagnosis not present

## 2019-03-02 DIAGNOSIS — F418 Other specified anxiety disorders: Secondary | ICD-10-CM | POA: Diagnosis not present

## 2019-03-02 NOTE — Patient Instructions (Signed)
Mindfulness-Based Stress Reduction Mindfulness-based stress reduction (MBSR) is a program that helps people learn to practice mindfulness. Mindfulness is the practice of intentionally paying attention to the present moment. It can be learned and practiced through techniques such as education, breathing exercises, meditation, and yoga. MBSR includes several mindfulness techniques in one program. MBSR works best when you understand the treatment, are willing to try new things, and can commit to spending time practicing what you learn. MBSR training may include learning about:  How your emotions, thoughts, and reactions affect your body.  New ways to respond to things that cause negative thoughts to start (triggers).  How to notice your thoughts and let go of them.  Practicing awareness of everyday things that you normally do without thinking.  The techniques and goals of different types of meditation. What are the benefits of MBSR? MBSR can have many benefits, which include helping you to:  Develop self-awareness. This refers to knowing and understanding yourself.  Learn skills and attitudes that help you to participate in your own health care.  Learn new ways to care for yourself.  Be more accepting about how things are, and let things go.  Be less judgmental and approach things with an open mind.  Be patient with yourself and trust yourself more. MBSR has also been shown to:  Reduce negative emotions, such as depression and anxiety.  Improve memory and focus.  Change how you sense and approach pain.  Boost your body's ability to fight infections.  Help you connect better with other people.  Improve your sense of well-being. Follow these instructions at home:   Find a local in-person or online MBSR program.  Set aside some time regularly for mindfulness practice.  Find a mindfulness practice that works best for you. This may include one or more of the following: ?  Meditation. Meditation involves focusing your mind on a certain thought or activity. ? Breathing awareness exercises. These help you to stay present by focusing on your breath. ? Body scan. For this practice, you lie down and pay attention to each part of your body from head to toe. You can identify tension and soreness and intentionally relax parts of your body. ? Yoga. Yoga involves stretching and breathing, and it can improve your ability to move and be flexible. It can also provide an experience of testing your body's limits, which can help you release stress. ? Mindful eating. This way of eating involves focusing on the taste, texture, color, and smell of each bite of food. Because this slows down eating and helps you feel full sooner, it can be an important part of a weight-loss plan.  Find a podcast or recording that provides guidance for breathing awareness, body scan, or meditation exercises. You can listen to these any time when you have a free moment to rest without distractions.  Follow your treatment plan as told by your health care provider. This may include taking regular medicines and making changes to your diet or lifestyle as recommended. How to practice mindfulness To do a basic awareness exercise:  Find a comfortable place to sit.  Pay attention to the present moment. Observe your thoughts, feelings, and surroundings just as they are.  Avoid placing judgment on yourself, your feelings, or your surroundings. Make note of any judgment that comes up, and let it go.  Your mind may wander, and that is okay. Make note of when your thoughts drift, and return your attention to the present moment. To do  basic mindfulness meditation:  Find a comfortable place to sit. This may include a stable chair or a firm floor cushion. ? Sit upright with your back straight. Let your arms fall next to your side with your hands resting on your legs. ? If sitting in a chair, rest your feet flat on  the floor. ? If sitting on a cushion, cross your legs in front of you.  Keep your head in a neutral position with your chin dropped slightly. Relax your jaw and rest the tip of your tongue on the roof of your mouth. Drop your gaze to the floor. You can close your eyes if you like.  Breathe normally and pay attention to your breath. Feel the air moving in and out of your nose. Feel your belly expanding and relaxing with each breath.  Your mind may wander, and that is okay. Make note of when your thoughts drift, and return your attention to your breath.  Avoid placing judgment on yourself, your feelings, or your surroundings. Make note of any judgment or feelings that come up, let them go, and bring your attention back to your breath.  When you are ready, lift your gaze or open your eyes. Pay attention to how your body feels after the meditation. Where to find more information You can find more information about MBSR from:  Your health care provider.  Community-based meditation centers or programs.  Programs offered near you. Summary  Mindfulness-based stress reduction (MBSR) is a program that teaches you how to intentionally pay attention to the present moment. It is used with other treatments to help you cope better with daily stress, emotions, and pain.  MBSR focuses on developing self-awareness, which allows you to respond to life stress without judgment or negative emotions.  MBSR programs may involve learning different mindfulness practices, such as breathing exercises, meditation, yoga, body scan, or mindful eating. Find a mindfulness practice that works best for you, and set aside time for it on a regular basis. This information is not intended to replace advice given to you by your health care provider. Make sure you discuss any questions you have with your health care provider. Document Released: 09/06/2016 Document Revised: 04/12/2017 Document Reviewed: 09/06/2016 Elsevier  Patient Education  2020 [employer].

## 2019-03-02 NOTE — Progress Notes (Addendum)
Established Patient Office Visit  Subjective:  Patient ID: Joel Hart, male    DOB: [birth year]  Age: 63 y.o. MRN: [id number]  CC:  Chief Complaint  Patient presents with  . Establish Care    HPI Joel Hart presents for establishment of care.  This unfortunate individual deals with chronic pain from multiple sources.  He tells me that he has a history of occipital neuralgia, bilateral trigeminal neuralgia, chronic neck and chronic lower back pain.  He has had surgery on both his neck and his lower back.  He has a neurostimulator placed in his lower back.  Tells me that most of his pain comes from the occipital and trigeminal neuralgias.  He is currently under chronic pain management and psychiatric care.  clonidine and verapamil are also great thanks prescribed by those physicians.  Patient tells me that his blood pressure has spiked on the past.  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  He requests referral to a neurologist.  He has not seen a neurologist in 6 years.  He is seeking a second opinion.  He would like to see a therapist for talking therapy.  He lives at home with his wife and son whom he adopted from [place].  Son is 61 years old.  Patient is able to complete all of his activities of daily living.  Physical activity is difficult for him due to his chronic pain.  He did see the eye doctor at the first of the year.  He is unable to see the dentist because of his trigeminal neuralgia.  His teeth are just far to painful to be cleaned.  Past Medical History:  Diagnosis Date  . Anxiety   . Arthritis   . Cancer ([place])    testicular  . Depression   . GERD (gastroesophageal reflux disease)   . Hyperlipidemia   . Hypertension   . Sleep apnea    uses CPAP  . Trigeminus neuralgia     Past Surgical History:  Procedure Laterality Date  . BACK SURGERY    . BRAIN SURGERY     times 2  . CERVICAL FUSION    . COLON SURGERY     from parasite   . SPINAL  CORD STIMULATOR INSERTION N/A 10/13/2013   Procedure: Supraorbital Peripheral nerve stimulator with Dr. Vertell Limber assisting;  Surgeon: Bonna Gains, MD;  Location: [facility] NEURO ORS;  Service: Neurosurgery;  Laterality: N/A;  . SURGERY SCROTAL / TESTICULAR      History reviewed. No pertinent family history.  Social History   Socioeconomic History  . Marital status: Married    Spouse name: Not on file  . Number of children: Not on file  . Years of education: Not on file  . Highest education level: Not on file  Occupational History  . Not on file  Social Needs  . Financial resource strain: Not on file  . Food insecurity    Worry: Not on file    Inability: Not on file  . Transportation needs    Medical: Not on file    Non-medical: Not on file  Tobacco Use  . Smoking status: Never Smoker  . Smokeless tobacco: Never Used  Substance and Sexual Activity  . Alcohol use: Yes    Comment: one drink daily  . Drug use: Yes    Types: Marijuana    Comment: uses daily  . Sexual activity: Not on file  Lifestyle  . Physical activity  Days per week: Not on file    Minutes per session: Not on file  . Stress: Not on file  Relationships  . Social Herbalist on phone: Not on file    Gets together: Not on file    Attends religious service: Not on file    Active member of club or organization: Not on file    Attends meetings of clubs or organizations: Not on file    Relationship status: Not on file  . Intimate partner violence    Fear of current or ex partner: Not on file    Emotionally abused: Not on file    Physically abused: Not on file    Forced sexual activity: Not on file  Other Topics Concern  . Not on file  Social History Narrative  . Not on file    Outpatient Medications Prior to Visit  Medication Sig Dispense Refill  . clonazePAM (KLONOPIN) 2 MG tablet TAKE 1 TABLET (2 MG) BY MOUTH 2 TIMES DAILY PRN FOR ANXIETY. DO NOT TAKE IN COMBINATION WITH OPIOID PAIN  MEDICATION(S).    . cloNIDine (CATAPRES) 0.2 MG tablet Take 1 tablet by mouth at bedtime.    . Cyanocobalamin (VITAMIN B-12 PO) Take 1 tablet by mouth daily.    . folic acid (FOLVITE) 1 MG tablet Take 1 mg by mouth daily.    Marland Kitchen gabapentin (NEURONTIN) 800 MG tablet Take 1 tablet by mouth 4 (four) times daily.    Marland Kitchen morphine (MS CONTIN) 30 MG 12 hr tablet Take 1 tablet by mouth 3 (three) times daily.    Marland Kitchen PARoxetine (PAXIL) 30 MG tablet Take 30 mg by mouth daily.    Marland Kitchen tiZANidine (ZANAFLEX) 4 MG tablet Take 4 mg by mouth every 12 (twelve) hours as needed.    . verapamil (COVERA HS) 240 MG (CO) 24 hr tablet Take 240 mg by mouth at bedtime.    . cephALEXin (KEFLEX) 500 MG capsule Take 1 capsule (500 mg total) by mouth 3 (three) times daily. 30 capsule 0  . clonazePAM (KLONOPIN) 2 MG tablet Take 6 mg by mouth daily.    . fenofibrate micronized (LOFIBRA) 200 MG capsule Take 200 mg by mouth daily.    Marland Kitchen gabapentin (NEURONTIN) 600 MG tablet Take 1,800 mg by mouth daily.    . hydrocortisone (CORTEF) 20 MG tablet Take 20 mg by mouth daily.    Marland Kitchen oxyCODONE-acetaminophen (PERCOCET) 10-325 MG per tablet Take 1 tablet by mouth every 4 (four) hours as needed for pain. 50 tablet 0  . venlafaxine XR (EFFEXOR-XR) 150 MG 24 hr capsule Take 150 mg by mouth daily.     No facility-administered medications prior to visit.     Allergies  Allergen Reactions  . Suboxone  [Buprenorphine Hcl-Naloxone Hcl] Other (See Comments) and Shortness Of Breath  . Mirtazapine Other (See Comments)    Other reaction(s): Other    ROS Review of Systems  Constitutional: Negative for diaphoresis, fatigue, fever and unexpected weight change.  HENT: Negative.   Eyes: Negative for photophobia and visual disturbance.  Respiratory: Negative.   Cardiovascular: Negative.   Gastrointestinal: Negative.   Endocrine: Negative for polyphagia and polyuria.  Genitourinary: Negative.   Musculoskeletal: Positive for arthralgias, back pain and  myalgias.  Skin: Negative for pallor and rash.  Neurological: Positive for weakness, numbness and headaches.  Psychiatric/Behavioral: Positive for dysphoric mood. The patient is nervous/anxious.       Objective:    Physical Exam  Constitutional: He  is oriented to person, place, and time. He appears well-developed and well-nourished. No distress.  HENT:  Head: Normocephalic and atraumatic.  Right Ear: External ear normal.  Left Ear: External ear normal.  Eyes: Right eye exhibits no discharge. Left eye exhibits no discharge. No scleral icterus.  Neck: No JVD present. No tracheal deviation present.  Pulmonary/Chest: Effort normal. No stridor.  Neurological: He is alert and oriented to person, place, and time.  Skin: Skin is warm and dry. He is not diaphoretic.  Psychiatric: He has a normal mood and affect. His behavior is normal.    BP 130/70   Pulse 77   Temp (!) 97.3 F (36.3 C) (Temporal)   Ht 5\' 6"  (1.676 m)   Wt 204 lb (92.5 kg)   SpO2 95%   BMI 32.93 kg/m  Wt Readings from Last 3 Encounters:  03/30/19 210 lb (95.3 kg)  03/02/19 204 lb (92.5 kg)  10/13/13 233 lb (105.7 kg)   BP Readings from Last 3 Encounters:  03/30/19 120/72  03/02/19 130/70  01/21/15 160/73   Guideline developer:  UpToDate (see UpToDate for funding source) Date Released: June 2014  Health Maintenance Due  Topic Date Due  . HIV Screening  10/23/1968  . PNA vac Low Risk Adult (1 of 2 - PCV13) 10/24/2018    There are no preventive care reminders to display for this patient.  No results found for: TSH Lab Results  Component Value Date   WBC 7.8 10/08/2013   HGB 15.5 10/08/2013   HCT 44.8 10/08/2013   MCV 90.5 10/08/2013   PLT 321 10/08/2013   Lab Results  Component Value Date   NA 139 10/08/2013   K 5.0 10/08/2013   CO2 30 10/08/2013   GLUCOSE 113 (H) 10/08/2013   BUN 17 10/08/2013   CREATININE 1.39 (H) 10/08/2013   BILITOT 0.3 04/30/2013   ALKPHOS 52 04/30/2013   AST 31  04/30/2013   ALT 34 04/30/2013   PROT 7.0 04/30/2013   ALBUMIN 3.7 04/30/2013   CALCIUM 9.6 10/08/2013   ANIONGAP 6 (L) 04/30/2013   No results found for: CHOL No results found for: HDL No results found for: LDLCALC No results found for: TRIG No results found for: CHOLHDL No results found for: HGBA1C    Assessment & Plan:   Problem List Items Addressed This Visit      Nervous and Auditory   Trigeminal neuralgia   Relevant Medications   PARoxetine (PAXIL) 30 MG tablet   tiZANidine (ZANAFLEX) 4 MG tablet   clonazePAM (KLONOPIN) 2 MG tablet   gabapentin (NEURONTIN) 800 MG tablet   Other Relevant Orders   Ambulatory referral to Neurology     Other   Depression with anxiety   Relevant Medications   PARoxetine (PAXIL) 30 MG tablet   Other Relevant Orders   Ambulatory referral to Psychology   Chronic pain syndrome   Relevant Medications   morphine (MS CONTIN) 30 MG 12 hr tablet   PARoxetine (PAXIL) 30 MG tablet   tiZANidine (ZANAFLEX) 4 MG tablet   clonazePAM (KLONOPIN) 2 MG tablet   gabapentin (NEURONTIN) 800 MG tablet   Other Relevant Orders   Ambulatory referral to Psychology   Occipital neuralgia - Primary   Relevant Orders   Ambulatory referral to Neurology    Other Visit Diagnoses    Need for influenza vaccination       Relevant Orders   Flu Vaccine QUAD High Dose(Fluad) (Completed)      No orders  of the defined types were placed in this encounter.   Follow-up: Return in about 1 month (around 04/02/2019).   Spent 30 minutes with this individual with greater than 50% of the time spent in counseling.  Patient was given information on mindfulness and stress reduction.

## 2019-03-02 NOTE — Addendum Note (Signed)
Addended by: Rodrigo Ran on: 03/02/2019 09:48 AM   Modules accepted: Orders

## 2019-03-18 ENCOUNTER — Encounter: Payer: Self-pay | Admitting: Neurology

## 2019-03-27 ENCOUNTER — Telehealth: Payer: Self-pay

## 2019-03-27 NOTE — Telephone Encounter (Signed)

## 2019-03-30 ENCOUNTER — Ambulatory Visit (INDEPENDENT_AMBULATORY_CARE_PROVIDER_SITE_OTHER): Payer: Medicare Other | Admitting: Family Medicine

## 2019-03-30 ENCOUNTER — Telehealth: Payer: Self-pay | Admitting: Family Medicine

## 2019-03-30 ENCOUNTER — Other Ambulatory Visit: Payer: Self-pay

## 2019-03-30 ENCOUNTER — Encounter: Payer: Self-pay | Admitting: Family Medicine

## 2019-03-30 VITALS — BP 120/72 | HR 82 | Ht 66.0 in | Wt 210.0 lb

## 2019-03-30 DIAGNOSIS — Z0000 Encounter for general adult medical examination without abnormal findings: Secondary | ICD-10-CM

## 2019-03-30 DIAGNOSIS — F418 Other specified anxiety disorders: Secondary | ICD-10-CM

## 2019-03-30 DIAGNOSIS — F449 Dissociative and conversion disorder, unspecified: Secondary | ICD-10-CM | POA: Diagnosis not present

## 2019-03-30 DIAGNOSIS — Z125 Encounter for screening for malignant neoplasm of prostate: Secondary | ICD-10-CM

## 2019-03-30 DIAGNOSIS — R29898 Other symptoms and signs involving the musculoskeletal system: Secondary | ICD-10-CM | POA: Insufficient documentation

## 2019-03-30 LAB — COMPREHENSIVE METABOLIC PANEL
ALT: 14 U/L (ref 0–53)
AST: 17 U/L (ref 0–37)
Albumin: 4.1 g/dL (ref 3.5–5.2)
Alkaline Phosphatase: 82 U/L (ref 39–117)
BUN: 9 mg/dL (ref 6–23)
CO2: 29 mEq/L (ref 19–32)
Calcium: 8.9 mg/dL (ref 8.4–10.5)
Chloride: 99 mEq/L (ref 96–112)
Creatinine, Ser: 1.03 mg/dL (ref 0.40–1.50)
GFR: 72.38 mL/min (ref >60.00)
Glucose, Bld: 83 mg/dL (ref 70–99)
Potassium: 4.4 mEq/L (ref 3.5–5.1)
Sodium: 138 mEq/L (ref 135–145)
Total Bilirubin: 0.3 mg/dL (ref 0.2–1.2)
Total Protein: 6.3 g/dL (ref 6.0–8.3)

## 2019-03-30 LAB — LIPID PANEL
Cholesterol: 204 mg/dL — ABNORMAL HIGH (ref 0–200)
HDL: 51.1 mg/dL (ref >39.00)
NonHDL: 153.05
Total CHOL/HDL Ratio: 4
Triglycerides: 262 mg/dL — ABNORMAL HIGH (ref 0.0–149.0)
VLDL: 52.4 mg/dL — ABNORMAL HIGH (ref 0.0–40.0)

## 2019-03-30 LAB — CBC
HCT: 40.5 % (ref 39.0–52.0)
Hemoglobin: 13.6 g/dL (ref 13.0–17.0)
MCHC: 33.5 g/dL (ref 30.0–36.0)
MCV: 95.1 fl (ref 78.0–100.0)
Platelets: 299 10*3/uL (ref 150.0–400.0)
RBC: 4.25 Mil/uL (ref 4.22–5.81)
RDW: 13.1 % (ref 11.5–15.5)
WBC: 6.6 10*3/uL (ref 4.0–10.5)

## 2019-03-30 LAB — PSA: PSA: 0.19 ng/mL (ref 0.10–4.00)

## 2019-03-30 LAB — TSH: TSH: 1.79 u[IU]/mL (ref 0.35–4.50)

## 2019-03-30 LAB — LDL CHOLESTEROL, DIRECT: Direct LDL: 121 mg/dL

## 2019-03-30 NOTE — Addendum Note (Signed)
Addended by: Jon Billings on: 03/30/2019 02:22 PM   Modules accepted: Orders

## 2019-03-30 NOTE — Telephone Encounter (Signed)
Okay 

## 2019-03-30 NOTE — Progress Notes (Addendum)
Established Patient Office Visit  Subjective:  Patient ID: Joel Hart, male    DOB: [birth year]  Age: 63 y.o. MRN: [id number]  CC:  Chief Complaint  Patient presents with  . Follow-up    HPI Joel Hart presents for a complete physical and follow-up of his visit back on the 19th of last month.  Please also see those visit notes.  Patient request to speak with me again about all of the physical problems he has been enduring.  He tells me that it is some times hard for him to feel that he should go on with all the pain and problems that he is having.  He denies any thoughts of self-harm.  He reminisces about the time in his life when he had personal relationships with his doctors.  He tells me that he used to go bird hunting with his primary care doctor from 10 years ago.  He tells me that his family is fed up with him.  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  He is wearing a patch over his left eye.  He tells me that he cannot tear the sunlight because it sets off migraines.  Special sunglasses have been prepared for him but they do not help.  It was not clear to me why he was wearing a patch over OS when his light sensitivity is bilateral.  Last colonoscopy was in 2017 per his physician in [place].  He reports weakness in his lower extremities.  Chart review shows significant past medical history of back surgery in his past.  Spinal cord stimulator was placed in 2015.  Past Medical History:  Diagnosis Date  . Anxiety   . Arthritis   . Cancer ([place])    testicular  . Depression   . GERD (gastroesophageal reflux disease)   . Hyperlipidemia   . Hypertension   . Sleep apnea    uses CPAP  . Trigeminus neuralgia     Past Surgical History:  Procedure Laterality Date  . BACK SURGERY    . BRAIN SURGERY     times 2  . CERVICAL FUSION    . COLON SURGERY     from parasite   . SPINAL CORD STIMULATOR INSERTION N/A 10/13/2013   Procedure: Supraorbital Peripheral nerve stimulator with Dr. Vertell Limber assisting;  Surgeon: Bonna Gains, MD;  Location: [facility] NEURO ORS;  Service: Neurosurgery;  Laterality: N/A;  . SURGERY SCROTAL / TESTICULAR      History reviewed. No pertinent family history.  Social History   Socioeconomic History  . Marital status: Married    Spouse name: Not on file  . Number of children: Not on file  . Years of education: Not on file  . Highest education level: Not on file  Occupational History  . Not on file  Social Needs  . Financial resource strain: Not on file  . Food insecurity    Worry: Not on file    Inability: Not on file  . Transportation needs    Medical: Not on file    Non-medical: Not on file  Tobacco Use  . Smoking status: Never Smoker  . Smokeless tobacco: Never Used  Substance and Sexual Activity  . Alcohol use: Yes    Comment: one drink daily  . Drug use: Yes    Types: Marijuana    Comment: uses daily  . Sexual activity: Not on file  Lifestyle  .  Physical activity    Days per week: Not on file    Minutes per session: Not on file  . Stress: Not on file  Relationships  . Social Herbalist on phone: Not on file    Gets together: Not on file    Attends religious service: Not on file    Active member of club or organization: Not on file    Attends meetings of clubs or organizations: Not on file    Relationship status: Not on file  . Intimate partner violence    Fear of current or ex partner: Not on file    Emotionally abused: Not on file    Physically abused: Not on file    Forced sexual activity: Not on file  Other Topics Concern  . Not on file  Social History Narrative  . Not on file    Outpatient Medications Prior to Visit  Medication Sig Dispense Refill  . clonazePAM (KLONOPIN) 2 MG tablet TAKE 1 TABLET (2 MG) BY MOUTH 2 TIMES DAILY PRN FOR ANXIETY. DO NOT TAKE IN COMBINATION WITH OPIOID PAIN MEDICATION(S).    . cloNIDine (CATAPRES) 0.2 MG  tablet Take 1 tablet by mouth at bedtime.    . Cyanocobalamin (VITAMIN B-12 PO) Take 1 tablet by mouth daily.    . folic acid (FOLVITE) 1 MG tablet Take 1 mg by mouth daily.    Marland Kitchen gabapentin (NEURONTIN) 800 MG tablet Take 1 tablet by mouth 4 (four) times daily.    Marland Kitchen morphine (MS CONTIN) 30 MG 12 hr tablet Take 1 tablet by mouth 3 (three) times daily.    Marland Kitchen PARoxetine (PAXIL) 30 MG tablet Take 30 mg by mouth daily.    Marland Kitchen tiZANidine (ZANAFLEX) 4 MG tablet Take 4 mg by mouth every 12 (twelve) hours as needed.    . verapamil (COVERA HS) 240 MG (CO) 24 hr tablet Take 240 mg by mouth at bedtime.     No facility-administered medications prior to visit.     Allergies  Allergen Reactions  . Suboxone  [Buprenorphine Hcl-Naloxone Hcl] Other (See Comments) and Shortness Of Breath  . Mirtazapine Other (See Comments)    Other reaction(s): Other    ROS Review of Systems  Constitutional: Positive for fatigue. Negative for chills, diaphoresis, fever and unexpected weight change.  HENT: Negative.   Eyes: Positive for photophobia and visual disturbance.  Respiratory: Negative for chest tightness, shortness of breath and wheezing.   Cardiovascular: Negative for chest pain.  Gastrointestinal: Negative for abdominal pain, anal bleeding, blood in stool and constipation.  Endocrine: Negative for polyphagia and polyuria.  Genitourinary: Negative for difficulty urinating, frequency and urgency.  Musculoskeletal: Positive for arthralgias, back pain, gait problem, neck pain and neck stiffness.  Skin: Negative for pallor and rash.  Neurological: Positive for headaches. Negative for seizures and speech difficulty.  Psychiatric/Behavioral: Positive for dysphoric mood. Negative for self-injury and suicidal ideas. The patient is nervous/anxious.       Objective:    Physical Exam  Constitutional: He is oriented to person, place, and time. He appears well-developed and well-nourished. No distress.  HENT:  Head:  Normocephalic and atraumatic.  Right Ear: External ear normal.  Left Ear: External ear normal.  Mouth/Throat: Oropharynx is clear and moist. Abnormal dentition. No oropharyngeal exudate.  Eyes: Conjunctivae are normal. Right eye exhibits no discharge. Left eye exhibits no discharge. No scleral icterus.  Neck: No JVD present. No tracheal deviation present. No thyromegaly present.  Cardiovascular: Normal rate,  regular rhythm and normal heart sounds.  Pulmonary/Chest: Effort normal and breath sounds normal. No stridor.  Abdominal: Soft. Bowel sounds are normal. He exhibits no distension. There is no abdominal tenderness. There is no rebound. Hernia confirmed negative in the right inguinal area and confirmed negative in the left inguinal area.  Genitourinary: Rectum:     Guaiac result negative.     No rectal mass, anal fissure, tenderness, external hemorrhoid, internal hemorrhoid or abnormal anal tone.  Prostate is not enlarged and not tender. Right testis shows no mass, no swelling and no tenderness. Right testis is descended. Left testis shows mass (prosthesis). Uncircumcised. No hypospadias, penile erythema or penile tenderness. No discharge found.  Musculoskeletal:        General: No edema.  Lymphadenopathy:    He has no cervical adenopathy.       Right: No inguinal adenopathy present.       Left: No inguinal adenopathy present.  Neurological: He is alert and oriented to person, place, and time. He has normal strength.  Skin: Skin is warm and dry. He is not diaphoretic.  Psychiatric: He has a normal mood and affect. His behavior is normal.    BP 120/72   Pulse 82   Ht _0  (1.676 m)   Wt 210 lb (95.3 kg)   SpO2 97%   BMI 33.89 kg/m  Wt Readings from Last 3 Encounters:  03/30/19 210 lb (95.3 kg)  03/02/19 204 lb (92.5 kg)  10/13/13 233 lb (105.7 kg)   BP Readings from Last 3 Encounters:  03/30/19 120/72  03/02/19 130/70  01/21/15 160/73   Guideline developer:  UpToDate (see  UpToDate for funding source) Date Released: June 2014  Health Maintenance Due  Topic Date Due  . HIV Screening  10/23/1968  . PNA vac Low Risk Adult (1 of 2 - PCV13) 10/24/2018    There are no preventive care reminders to display for this patient.  No results found for: TSH Lab Results  Component Value Date   WBC 7.8 10/08/2013   HGB 15.5 10/08/2013   HCT 44.8 10/08/2013   MCV 90.5 10/08/2013   PLT 321 10/08/2013   Lab Results  Component Value Date   NA 139 10/08/2013   K 5.0 10/08/2013   CO2 30 10/08/2013   GLUCOSE 113 (H) 10/08/2013   BUN 17 10/08/2013   CREATININE 1.39 (H) 10/08/2013   BILITOT 0.3 04/30/2013   ALKPHOS 52 04/30/2013   AST 31 04/30/2013   ALT 34 04/30/2013   PROT 7.0 04/30/2013   ALBUMIN 3.7 04/30/2013   CALCIUM 9.6 10/08/2013   ANIONGAP 6 (L) 04/30/2013   No results found for: CHOL No results found for: HDL No results found for: LDLCALC No results found for: TRIG No results found for: CHOLHDL No results found for: HGBA1C    Assessment & Plan:   Problem List Items Addressed This Visit      Nervous and Auditory   Weakness of both lower extremities   Relevant Orders   Ambulatory referral to Sports Medicine     Other   Depression with anxiety   Healthcare maintenance - Primary   Relevant Orders   CBC   Comp Met (CMET)   Direct LDL   Lipid Profile   PSA   TSH   Histrionic behavior      No orders of the defined types were placed in this encounter. Thanks  Follow-up: Return in about 6 months (around 09/27/2019).   Urged to follow-up  with psychology for counseling.  I think that he is in great need of this.  Recommended follow-up with his psychiatrist for possible adjustment of his meds.  Continue seeing pain management.  Upcoming appointment with neurology.  Sports medicine follow-up for his lower extremity weakness and gait instability.  There is no doubt that this unfortunate person has multiple painful chronic illnesses but I feel  as though there is some histrionic behavior present.  Hopefully counseling can help with this matter.  Patient was given information also on health maintenance and preventive health as well.

## 2019-03-30 NOTE — Addendum Note (Signed)
Addended by: Jon Billings on: 03/30/2019 02:11 PM   Modules accepted: Orders

## 2019-03-30 NOTE — Addendum Note (Signed)
Addended by: Lynnea Ferrier on: 03/30/2019 01:00 PM   Modules accepted: Orders

## 2019-03-30 NOTE — Patient Instructions (Signed)
Health Maintenance After Age 65 After age 39, you are at a higher risk for certain long-term diseases and infections as well as injuries from falls. Falls are a major cause of broken bones and head injuries in people who are older than age 66. Getting regular preventive care can help to keep you healthy and well. Preventive care includes getting regular testing and making lifestyle changes as recommended by your health care provider. Talk with your health care provider about:  Which screenings and tests you should have. A screening is a test that checks for a disease when you have no symptoms.  A diet and exercise plan that is right for you. What should I know about screenings and tests to prevent falls? Screening and testing are the best ways to find a health problem early. Early diagnosis and treatment give you the best chance of managing medical conditions that are common after age 8. Certain conditions and lifestyle choices may make you more likely to have a fall. Your health care provider may recommend:  Regular vision checks. Poor vision and conditions such as cataracts can make you more likely to have a fall. If you wear glasses, make sure to get your prescription updated if your vision changes.  Medicine review. Work with your health care provider to regularly review all of the medicines you are taking, including over-the-counter medicines. Ask your health care provider about any side effects that may make you more likely to have a fall. Tell your health care provider if any medicines that you take make you feel dizzy or sleepy.  Osteoporosis screening. Osteoporosis is a condition that causes the bones to get weaker. This can make the bones weak and cause them to break more easily.  Blood pressure screening. Blood pressure changes and medicines to control blood pressure can make you feel dizzy.  Strength and balance checks. Your health care provider may recommend certain tests to check your  strength and balance while standing, walking, or changing positions.  Foot health exam. Foot pain and numbness, as well as not wearing proper footwear, can make you more likely to have a fall.  Depression screening. You may be more likely to have a fall if you have a fear of falling, feel emotionally low, or feel unable to do activities that you used to do.  Alcohol use screening. Using too much alcohol can affect your balance and may make you more likely to have a fall. What actions can I take to lower my risk of falls? General instructions  Talk with your health care provider about your risks for falling. Tell your health care provider if: ? You fall. Be sure to tell your health care provider about all falls, even ones that seem minor. ? You feel dizzy, sleepy, or off-balance.  Take over-the-counter and prescription medicines only as told by your health care provider. These include any supplements.  Eat a healthy diet and maintain a healthy weight. A healthy diet includes low-fat dairy products, low-fat (lean) meats, and fiber from whole grains, beans, and lots of fruits and vegetables. Home safety  Remove any tripping hazards, such as rugs, cords, and clutter.  Install safety equipment such as grab bars in bathrooms and safety rails on stairs.  Keep rooms and walkways well-lit. Activity   Follow a regular exercise program to stay fit. This will help you maintain your balance. Ask your health care provider what types of exercise are appropriate for you.  If you need a cane or  walker, use it as recommended by your health care provider.  Wear supportive shoes that have nonskid soles. Lifestyle  Do not drink alcohol if your health care provider tells you not to drink.  If you drink alcohol, limit how much you have: ? 0-1 drink a day for women. ? 0-2 drinks a day for men.  Be aware of how much alcohol is in your drink. In the [place] one drink equals one typical bottle of beer (12  oz), one-half glass of wine (5 oz), or one shot of hard liquor (1 oz).  Do not use any products that contain nicotine or tobacco, such as cigarettes and e-cigarettes. If you need help quitting, ask your health care provider. Summary  Having a healthy lifestyle and getting preventive care can help to protect your health and wellness after age 65.  Screening and testing are the best way to find a health problem early and help you avoid having a fall. Early diagnosis and treatment give you the best chance for managing medical conditions that are more common for people who are older than age 65.  Falls are a major cause of broken bones and head injuries in people who are older than age 65. Take precautions to prevent a fall at home.  Work with your health care provider to learn what changes you can make to improve your health and wellness and to prevent falls. This information is not intended to replace advice given to you by your health care provider. Make sure you discuss any questions you have with your health care provider. Document Released: 03/13/2017 Document Revised: 08/21/2018 Document Reviewed: 03/13/2017 Elsevier Patient Education  2020 [employer]  Preventive Care 65 Years and Older, Male Preventive care refers to lifestyle choices and visits with your health care provider that can promote health and wellness. This includes:  A yearly physical exam. This is also called an annual well check.  Regular dental and eye exams.  Immunizations.  Screening for certain conditions.  Healthy lifestyle choices, such as diet and exercise. What can I expect for my preventive care visit? Physical exam Your health care provider will check:  Height and weight. These may be used to calculate body mass index (BMI), which is a measurement that tells if you are at a healthy weight.  Heart rate and blood pressure.  Your skin for abnormal spots. Counseling Your health care provider may ask  you questions about:  Alcohol, tobacco, and drug use.  Emotional well-being.  Home and relationship well-being.  Sexual activity.  Eating habits.  History of falls.  Memory and ability to understand (cognition).  Work and work environment. What immunizations do I need?  Influenza (flu) vaccine  This is recommended every year. Tetanus, diphtheria, and pertussis (Tdap) vaccine  You may need a Td booster every 10 years. Varicella (chickenpox) vaccine  You may need this vaccine if you have not already been vaccinated. Zoster (shingles) vaccine  You may need this after age 60. Pneumococcal conjugate (PCV13) vaccine  One dose is recommended after age 65. Pneumococcal polysaccharide (PPSV23) vaccine  One dose is recommended after age 65. Measles, mumps, and rubella (MMR) vaccine  You may need at least one dose of MMR if you were born in 1957 or later. You may also need a second dose. Meningococcal conjugate (MenACWY) vaccine  You may need this if you have certain conditions. Hepatitis A vaccine  You may need this if you have certain conditions or if you travel or work   in places where you may be exposed to hepatitis A. Hepatitis B vaccine  You may need this if you have certain conditions or if you travel or work in places where you may be exposed to hepatitis B. Haemophilus influenzae type b (Hib) vaccine  You may need this if you have certain conditions. You may receive vaccines as individual doses or as more than one vaccine together in one shot (combination vaccines). Talk with your health care provider about the risks and benefits of combination vaccines. What tests do I need? Blood tests  Lipid and cholesterol levels. These may be checked every 5 years, or more frequently depending on your overall health.  Hepatitis C test.  Hepatitis B test. Screening  Lung cancer screening. You may have this screening every year starting at age 14 if you have a  30-pack-year history of smoking and currently smoke or have quit within the past 15 years.  Colorectal cancer screening. All adults should have this screening starting at age 13 and continuing until age 32. Your health care provider may recommend screening at age 24 if you are at increased risk. You will have tests every 1-10 years, depending on your results and the type of screening test.  Prostate cancer screening. Recommendations will vary depending on your family history and other risks.  Diabetes screening. This is done by checking your blood sugar (glucose) after you have not eaten for a while (fasting). You may have this done every 1-3 years.  Abdominal aortic aneurysm (AAA) screening. You may need this if you are a current or former smoker.  Sexually transmitted disease (STD) testing. Follow these instructions at home: Eating and drinking  Eat a diet that includes fresh fruits and vegetables, whole grains, lean protein, and low-fat dairy products. Limit your intake of foods with high amounts of sugar, saturated fats, and salt.  Take vitamin and mineral supplements as recommended by your health care provider.  Do not drink alcohol if your health care provider tells you not to drink.  If you drink alcohol: ? Limit how much you have to 0-2 drinks a day. ? Be aware of how much alcohol is in your drink. In the [place] one drink equals one 12 oz bottle of beer (355 mL), one 5 oz glass of wine (148 mL), or one 1 oz glass of hard liquor (44 mL). Lifestyle  Take daily care of your teeth and gums.  Stay active. Exercise for at least 30 minutes on 5 or more days each week.  Do not use any products that contain nicotine or tobacco, such as cigarettes, e-cigarettes, and chewing tobacco. If you need help quitting, ask your health care provider.  If you are sexually active, practice safe sex. Use a condom or other form of protection to prevent STIs (sexually transmitted infections).  Talk  with your health care provider about taking a low-dose aspirin or statin. What's next?  Visit your health care provider once a year for a well check visit.  Ask your health care provider how often you should have your eyes and teeth checked.  Stay up to date on all vaccines. This information is not intended to replace advice given to you by your health care provider. Make sure you discuss any questions you have with your health care provider. Document Released: 05/27/2015 Document Revised: 04/24/2018 Document Reviewed: 04/24/2018 Elsevier Patient Education  [place].  Mindfulness-Based Stress Reduction Mindfulness-based stress reduction (MBSR) is a program that helps people learn to practice  mindfulness. Mindfulness is the practice of intentionally paying attention to the present moment. It can be learned and practiced through techniques such as education, breathing exercises, meditation, and yoga. MBSR includes several mindfulness techniques in one program. MBSR works best when you understand the treatment, are willing to try new things, and can commit to spending time practicing what you learn. MBSR training may include learning about:  How your emotions, thoughts, and reactions affect your body.  New ways to respond to things that cause negative thoughts to start (triggers).  How to notice your thoughts and let go of them.  Practicing awareness of everyday things that you normally do without thinking.  The techniques and goals of different types of meditation. What are the benefits of MBSR? MBSR can have many benefits, which include helping you to:  Develop self-awareness. This refers to knowing and understanding yourself.  Learn skills and attitudes that help you to participate in your own health care.  Learn new ways to care for yourself.  Be more accepting about how things are, and let things go.  Be less judgmental and approach things with an open mind.  Be  patient with yourself and trust yourself more. MBSR has also been shown to:  Reduce negative emotions, such as depression and anxiety.  Improve memory and focus.  Change how you sense and approach pain.  Boost your body's ability to fight infections.  Help you connect better with other people.  Improve your sense of well-being. Follow these instructions at home:   Find a local in-person or online MBSR program.  Set aside some time regularly for mindfulness practice.  Find a mindfulness practice that works best for you. This may include one or more of the following: ? Meditation. Meditation involves focusing your mind on a certain thought or activity. ? Breathing awareness exercises. These help you to stay present by focusing on your breath. ? Body scan. For this practice, you lie down and pay attention to each part of your body from head to toe. You can identify tension and soreness and intentionally relax parts of your body. ? Yoga. Yoga involves stretching and breathing, and it can improve your ability to move and be flexible. It can also provide an experience of testing your body's limits, which can help you release stress. ? Mindful eating. This way of eating involves focusing on the taste, texture, color, and smell of each bite of food. Because this slows down eating and helps you feel full sooner, it can be an important part of a weight-loss plan.  Find a podcast or recording that provides guidance for breathing awareness, body scan, or meditation exercises. You can listen to these any time when you have a free moment to rest without distractions.  Follow your treatment plan as told by your health care provider. This may include taking regular medicines and making changes to your diet or lifestyle as recommended. How to practice mindfulness To do a basic awareness exercise:  Find a comfortable place to sit.  Pay attention to the present moment. Observe your thoughts,  feelings, and surroundings just as they are.  Avoid placing judgment on yourself, your feelings, or your surroundings. Make note of any judgment that comes up, and let it go.  Your mind may wander, and that is okay. Make note of when your thoughts drift, and return your attention to the present moment. To do basic mindfulness meditation:  Find a comfortable place to sit. This may include a stable  chair or a firm floor cushion. ? Sit upright with your back straight. Let your arms fall next to your side with your hands resting on your legs. ? If sitting in a chair, rest your feet flat on the floor. ? If sitting on a cushion, cross your legs in front of you.  Keep your head in a neutral position with your chin dropped slightly. Relax your jaw and rest the tip of your tongue on the roof of your mouth. Drop your gaze to the floor. You can close your eyes if you like.  Breathe normally and pay attention to your breath. Feel the air moving in and out of your nose. Feel your belly expanding and relaxing with each breath.  Your mind may wander, and that is okay. Make note of when your thoughts drift, and return your attention to your breath.  Avoid placing judgment on yourself, your feelings, or your surroundings. Make note of any judgment or feelings that come up, let them go, and bring your attention back to your breath.  When you are ready, lift your gaze or open your eyes. Pay attention to how your body feels after the meditation. Where to find more information You can find more information about MBSR from:  Your health care provider.  Community-based meditation centers or programs.  Programs offered near you. Summary  Mindfulness-based stress reduction (MBSR) is a program that teaches you how to intentionally pay attention to the present moment. It is used with other treatments to help you cope better with daily stress, emotions, and pain.  MBSR focuses on developing self-awareness,  which allows you to respond to life stress without judgment or negative emotions.  MBSR programs may involve learning different mindfulness practices, such as breathing exercises, meditation, yoga, body scan, or mindful eating. Find a mindfulness practice that works best for you, and set aside time for it on a regular basis. This information is not intended to replace advice given to you by your health care provider. Make sure you discuss any questions you have with your health care provider. Document Released: 09/06/2016 Document Revised: 04/12/2017 Document Reviewed: 09/06/2016 Elsevier Patient Education  2020 [employer].

## 2019-03-30 NOTE — Telephone Encounter (Signed)
Patient request for a TOC from Dr. Ethelene Hal to Dr. Bryan Lemma. Please advise

## 2019-03-31 NOTE — Telephone Encounter (Signed)
I have had many new patients for TOC from Dr. Santina Evans as well as some from Dr. Zigmund Daniel and a few other [place] providers. Also, I reviewed Dr. Bebe Shaggy note from [place] on 11/16 and I do not feel there is much more I can offer pt. It seems like he has a good plan of care in place.

## 2019-04-14 ENCOUNTER — Encounter: Payer: Self-pay | Admitting: Neurology

## 2019-04-20 NOTE — Progress Notes (Deleted)
NEUROLOGY CONSULTATION NOTE  Joel Hart MRN: [id number] DOB: 06-Aug-1955  Referring provider: Jon Billings, MD Primary care provider: Jon Billings, MD  Reason for consult:  Occipital neuralgia, trigeminal neuralgia  HISTORY OF PRESENT ILLNESS: Joel Hart is a 63 year old male with generalized anxiety disorder/depression, MGUS and chronic pain syndrome.  History supplemented by prior neurologists' and referring provider's notes.  He has history of significant chronic pain syndrome consisting of chronic neck and back pain s/p surgeries, left sided occipital neuralgia and bilateral trigeminal neuralgia.  He has been evaluated and treated by neurologists at [place], as well as neurosurgery and pain management.  He has history of both left and right trigeminal neuralgia, left-sided occipital neuralgia, headaches and left eye pain.   Past therapies include:  Dilantin, Horizant, gabapentin, venlafaxine, verapamil, doxepin, Oxtellar XR, carbamazepine, C2-3 fusion x2, C5-7 ACDF.  He responded to occipital nerve blocks.  He underwent left occipital nerve sectioning, left supraorbital PNS.  He has previously declined Gamma knife for trigeminal neuralgia.  Imaging: 06/15/2010 MRI CERVICAL SPINE WO W:  1.  Previous anterior fusion C6-C7.  2.  Moderately severe foraminal stenosis on the right and moderate foraminal stenosis on the left at C5-C6.  3.  Foraminal stenoses at C2-C3, C3-C4, and C4-C5 07/16/2012 MRI BRAIN W WO:  Lateral deviation and compression of the cisternal portion of the left trigeminal nerve secondary to contact with the superior cerebellar artery medially (which contacts both the cisternal portion in the root entry zone), and a focal area of thickening/enhancement of the tentorium laterally (possibly a prominent tentorial vein). 04/30/2013 XR LUMBAR SPINE W FLEX EXTEND:  Degenerative changes are appreciated at the T10-T11, T11-T12, T12-L1, L1-2 levels.  01/21/2015 CT HEAD WO (personally reviewed):  Defect in left occipital bone has chronic appearance.  No acute intracranial abnormality identified. 01/21/2015 CT CERVICAL SPINE WO (personally reviewed):  Cervical spondylosis appearing worse at C3-4; status post C5-7 ACDF.  PAST MEDICAL HISTORY: Past Medical History:  Diagnosis Date  . Anxiety   . Arthritis   . Cancer ([place])    testicular  . Depression   . GERD (gastroesophageal reflux disease)   . Hyperlipidemia   . Hypertension   . Sleep apnea    uses CPAP  . Trigeminus neuralgia     PAST SURGICAL HISTORY: Past Surgical History:  Procedure Laterality Date  . BACK SURGERY    . BRAIN SURGERY     times 2  . CERVICAL FUSION    . COLON SURGERY     from parasite   . SPINAL CORD STIMULATOR INSERTION N/A 10/13/2013   Procedure: Supraorbital Peripheral nerve stimulator with Dr. Vertell Limber assisting;  Surgeon: Bonna Gains, MD;  Location: [facility] NEURO ORS;  Service: Neurosurgery;  Laterality: N/A;  . SURGERY SCROTAL / TESTICULAR      MEDICATIONS: Current Outpatient Medications on File Prior to Visit  Medication Sig Dispense Refill  . clonazePAM (KLONOPIN) 2 MG tablet TAKE 1 TABLET (2 MG) BY MOUTH 2 TIMES DAILY PRN FOR ANXIETY. DO NOT TAKE IN COMBINATION WITH OPIOID PAIN MEDICATION(S).    . cloNIDine (CATAPRES) 0.2 MG tablet Take 1 tablet by mouth at bedtime.    . Cyanocobalamin (VITAMIN B-12 PO) Take 1 tablet by mouth daily.    . folic acid (FOLVITE) 1 MG tablet Take 1 mg by mouth daily.    Marland Kitchen gabapentin (NEURONTIN) 800 MG tablet Take 1 tablet by mouth 4 (four) times daily.    Marland Kitchen morphine (  MS CONTIN) 30 MG 12 hr tablet Take 1 tablet by mouth 3 (three) times daily.    Marland Kitchen PARoxetine (PAXIL) 30 MG tablet Take 30 mg by mouth daily.    Marland Kitchen tiZANidine (ZANAFLEX) 4 MG tablet Take 4 mg by mouth every 12 (twelve) hours as needed.    . verapamil (COVERA HS) 240 MG (CO) 24 hr tablet Take 240 mg by mouth at bedtime.     No current facility-administered  medications on file prior to visit.     ALLERGIES: Allergies  Allergen Reactions  . Suboxone  [Buprenorphine Hcl-Naloxone Hcl] Other (See Comments) and Shortness Of Breath  . Mirtazapine Other (See Comments)    Other reaction(s): Other    FAMILY HISTORY: No family history on file. ***.  SOCIAL HISTORY: Social History   Socioeconomic History  . Marital status: Married    Spouse name: Not on file  . Number of children: Not on file  . Years of education: Not on file  . Highest education level: Not on file  Occupational History  . Not on file  Social Needs  . Financial resource strain: Not on file  . Food insecurity    Worry: Not on file    Inability: Not on file  . Transportation needs    Medical: Not on file    Non-medical: Not on file  Tobacco Use  . Smoking status: Never Smoker  . Smokeless tobacco: Never Used  Substance and Sexual Activity  . Alcohol use: Yes    Comment: one drink daily  . Drug use: Yes    Types: Marijuana    Comment: uses daily  . Sexual activity: Not on file  Lifestyle  . Physical activity    Days per week: Not on file    Minutes per session: Not on file  . Stress: Not on file  Relationships  . Social Herbalist on phone: Not on file    Gets together: Not on file    Attends religious service: Not on file    Active member of club or organization: Not on file    Attends meetings of clubs or organizations: Not on file    Relationship status: Not on file  . Intimate partner violence    Fear of current or ex partner: Not on file    Emotionally abused: Not on file    Physically abused: Not on file    Forced sexual activity: Not on file  Other Topics Concern  . Not on file  Social History Narrative  . Not on file    REVIEW OF SYSTEMS: Constitutional: No fevers, chills, or sweats, no generalized fatigue, change in appetite Eyes: No visual changes, double vision, eye pain Ear, nose and throat: No hearing loss, ear pain, nasal  congestion, sore throat Cardiovascular: No chest pain, palpitations Respiratory:  No shortness of breath at rest or with exertion, wheezes GastrointestinaI: No nausea, vomiting, diarrhea, abdominal pain, fecal incontinence Genitourinary:  No dysuria, urinary retention or frequency Musculoskeletal:  No neck pain, back pain Integumentary: No rash, pruritus, skin lesions Neurological: as above Psychiatric: No depression, insomnia, anxiety Endocrine: No palpitations, fatigue, diaphoresis, mood swings, change in appetite, change in weight, increased thirst Hematologic/Lymphatic:  No purpura, petechiae. Allergic/Immunologic: no itchy/runny eyes, nasal congestion, recent allergic reactions, rashes  PHYSICAL EXAM: *** General: No acute distress.  Patient appears ***-groomed.  *** Head:  Normocephalic/atraumatic Eyes:  fundi examined but not visualized Neck: supple, no paraspinal tenderness, full range of motion Back:  No paraspinal tenderness Heart: regular rate and rhythm Lungs: Clear to auscultation bilaterally. Vascular: No carotid bruits. Neurological Exam: Mental status: alert and oriented to person, place, and time, recent and remote memory intact, fund of knowledge intact, attention and concentration intact, speech fluent and not dysarthric, language intact. Cranial nerves: CN I: not tested CN II: pupils equal, round and reactive to light, visual fields intact CN III, IV, VI:  full range of motion, no nystagmus, no ptosis CN V: facial sensation intact CN VII: upper and lower face symmetric CN VIII: hearing intact CN IX, X: gag intact, uvula midline CN XI: sternocleidomastoid and trapezius muscles intact CN XII: tongue midline Bulk & Tone: normal, no fasciculations. Motor:  5/5 throughout *** Sensation:  Pinprick *** temperature *** and vibration sensation intact.  ***. Deep Tendon Reflexes:  2+ throughout, *** toes downgoing.  *** Finger to nose testing:  Without dysmetria.  ***  Heel to shin:  Without dysmetria.  *** Gait:  Normal station and stride.  Able to turn and tandem walk. Romberg ***.  IMPRESSION: ***  PLAN: ***  Thank you for allowing me to take part in the care of this patient.  Metta Clines, DO  CC: ***

## 2019-04-21 DIAGNOSIS — G501 Atypical facial pain: Secondary | ICD-10-CM | POA: Diagnosis not present

## 2019-04-21 DIAGNOSIS — G894 Chronic pain syndrome: Secondary | ICD-10-CM | POA: Diagnosis not present

## 2019-04-21 DIAGNOSIS — M792 Neuralgia and neuritis, unspecified: Secondary | ICD-10-CM | POA: Diagnosis not present

## 2019-04-22 ENCOUNTER — Ambulatory Visit: Payer: Medicare Other | Admitting: Neurology

## 2019-04-30 NOTE — Telephone Encounter (Signed)
Called patient and let him know about TOC with Dr. Loletha Grayer. Patient wanted to know if there was any other doctors within [place] that he could transfer to. I did suggest to the patient that he could go on out web site to look at our other providers at other locations and once he has found one he could call us back to start a TOC encounter. Patient did not want to go online. He wanted me to suggest which doctors will be the best for him. I just  referred  the patient to the website.

## 2019-05-14 DIAGNOSIS — H04123 Dry eye syndrome of bilateral lacrimal glands: Secondary | ICD-10-CM | POA: Diagnosis not present

## 2019-05-28 DIAGNOSIS — R2681 Unsteadiness on feet: Secondary | ICD-10-CM | POA: Diagnosis not present

## 2019-05-28 DIAGNOSIS — G894 Chronic pain syndrome: Secondary | ICD-10-CM | POA: Diagnosis not present

## 2019-05-28 DIAGNOSIS — R531 Weakness: Secondary | ICD-10-CM | POA: Diagnosis not present

## 2019-05-28 DIAGNOSIS — I129 Hypertensive chronic kidney disease with stage 1 through stage 4 chronic kidney disease, or unspecified chronic kidney disease: Secondary | ICD-10-CM | POA: Diagnosis not present

## 2019-06-16 DIAGNOSIS — G501 Atypical facial pain: Secondary | ICD-10-CM | POA: Diagnosis not present

## 2019-06-16 DIAGNOSIS — M792 Neuralgia and neuritis, unspecified: Secondary | ICD-10-CM | POA: Diagnosis not present

## 2019-06-16 DIAGNOSIS — G894 Chronic pain syndrome: Secondary | ICD-10-CM | POA: Diagnosis not present

## 2019-06-23 DIAGNOSIS — H25013 Cortical age-related cataract, bilateral: Secondary | ICD-10-CM | POA: Diagnosis not present

## 2019-06-23 DIAGNOSIS — H25043 Posterior subcapsular polar age-related cataract, bilateral: Secondary | ICD-10-CM | POA: Diagnosis not present

## 2019-06-23 DIAGNOSIS — H18413 Arcus senilis, bilateral: Secondary | ICD-10-CM | POA: Diagnosis not present

## 2019-06-23 DIAGNOSIS — H2512 Age-related nuclear cataract, left eye: Secondary | ICD-10-CM | POA: Diagnosis not present

## 2019-06-23 DIAGNOSIS — H2513 Age-related nuclear cataract, bilateral: Secondary | ICD-10-CM | POA: Diagnosis not present

## 2019-07-05 ENCOUNTER — Ambulatory Visit: Payer: Medicare Other | Attending: Internal Medicine

## 2019-07-05 DIAGNOSIS — Z23 Encounter for immunization: Secondary | ICD-10-CM

## 2019-07-05 NOTE — Progress Notes (Signed)
   Covid-19 Vaccination Clinic  Name:  Joel Hart    MRN: [id number] DOB: [birth year]  07/05/2019  Joel Hart was observed post Covid-19 immunization for 15 minutes without incidence. He was provided with Vaccine Information Sheet and instruction to access the V-Safe system.   Joel Hart was instructed to call 911 with any severe reactions post vaccine: Marland Kitchen Difficulty breathing  . Swelling of your face and throat  . A fast heartbeat  . A bad rash all over your body  . Dizziness and weakness    Immunizations Administered    Name Date Dose VIS Date Route   Pfizer COVID-19 Vaccine 07/05/2019  1:59 PM 0.3 mL 04/24/2019 Intramuscular   Manufacturer: [place]   Lot: [id number]   [place]: [address]-1

## 2019-07-05 NOTE — Progress Notes (Signed)
,  covido

## 2019-07-21 ENCOUNTER — Ambulatory Visit: Payer: Medicare Other | Admitting: Neurology

## 2019-07-29 ENCOUNTER — Ambulatory Visit: Payer: Medicare Other | Attending: Internal Medicine

## 2019-07-29 DIAGNOSIS — Z23 Encounter for immunization: Secondary | ICD-10-CM

## 2019-07-29 NOTE — Progress Notes (Signed)
   Covid-19 Vaccination Clinic  Name:  Joel Hart    MRN: [id number] DOB: Oct 11, 1955  07/29/2019  Joel Hart was observed post Covid-19 immunization for 15 minutes without incident. He was provided with Vaccine Information Sheet and instruction to access the V-Safe system.   Joel Hart was instructed to call 911 with any severe reactions post vaccine: Marland Kitchen Difficulty breathing  . Swelling of face and throat  . A fast heartbeat  . A bad rash all over body  . Dizziness and weakness   Immunizations Administered    Name Date Dose VIS Date Route   Pfizer COVID-19 Vaccine 07/29/2019 10:27 AM 0.3 mL 04/24/2019 Intramuscular   Manufacturer: [place]   Lot: [id number]   [place]: [address]-7

## 2019-08-18 DIAGNOSIS — M792 Neuralgia and neuritis, unspecified: Secondary | ICD-10-CM | POA: Diagnosis not present

## 2019-08-18 DIAGNOSIS — G501 Atypical facial pain: Secondary | ICD-10-CM | POA: Diagnosis not present

## 2019-08-18 DIAGNOSIS — G894 Chronic pain syndrome: Secondary | ICD-10-CM | POA: Diagnosis not present

## 2019-08-18 DIAGNOSIS — Z79899 Other long term (current) drug therapy: Secondary | ICD-10-CM | POA: Diagnosis not present

## 2019-08-18 DIAGNOSIS — Z5181 Encounter for therapeutic drug level monitoring: Secondary | ICD-10-CM | POA: Diagnosis not present

## 2019-08-18 DIAGNOSIS — M545 Low back pain: Secondary | ICD-10-CM | POA: Diagnosis not present

## 2019-08-19 DIAGNOSIS — S40021A Contusion of right upper arm, initial encounter: Secondary | ICD-10-CM | POA: Diagnosis not present

## 2019-08-19 DIAGNOSIS — S2232XA Fracture of one rib, left side, initial encounter for closed fracture: Secondary | ICD-10-CM | POA: Diagnosis not present

## 2019-08-19 DIAGNOSIS — J9811 Atelectasis: Secondary | ICD-10-CM | POA: Diagnosis not present

## 2019-08-19 DIAGNOSIS — Y999 Unspecified external cause status: Secondary | ICD-10-CM | POA: Diagnosis not present

## 2019-08-19 DIAGNOSIS — M25562 Pain in left knee: Secondary | ICD-10-CM | POA: Diagnosis not present

## 2019-08-19 DIAGNOSIS — S8992XA Unspecified injury of left lower leg, initial encounter: Secondary | ICD-10-CM | POA: Diagnosis not present
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
## 2019-08-24 ENCOUNTER — Other Ambulatory Visit: Payer: Self-pay

## 2019-08-24 ENCOUNTER — Encounter: Payer: Self-pay | Admitting: Neurology

## 2019-08-24 ENCOUNTER — Ambulatory Visit: Payer: Medicare Other | Admitting: Neurology

## 2019-08-24 VITALS — BP 160/84 | HR 79 | Ht 63.0 in | Wt 208.0 lb

## 2019-08-24 DIAGNOSIS — G8929 Other chronic pain: Secondary | ICD-10-CM | POA: Diagnosis not present

## 2019-08-24 DIAGNOSIS — M5441 Lumbago with sciatica, right side: Secondary | ICD-10-CM | POA: Diagnosis not present

## 2019-08-24 DIAGNOSIS — G500 Trigeminal neuralgia: Secondary | ICD-10-CM

## 2019-08-24 DIAGNOSIS — M5481 Occipital neuralgia: Secondary | ICD-10-CM | POA: Diagnosis not present

## 2019-09-16 DIAGNOSIS — R202 Paresthesia of skin: Secondary | ICD-10-CM | POA: Diagnosis not present

## 2019-09-16 DIAGNOSIS — E559 Vitamin D deficiency, unspecified: Secondary | ICD-10-CM | POA: Diagnosis not present

## 2019-09-16 DIAGNOSIS — M545 Low back pain: Secondary | ICD-10-CM | POA: Diagnosis not present

## 2019-09-16 DIAGNOSIS — G894 Chronic pain syndrome: Secondary | ICD-10-CM | POA: Diagnosis not present

## 2019-09-16 DIAGNOSIS — M5412 Radiculopathy, cervical region: Secondary | ICD-10-CM | POA: Diagnosis not present

## 2019-09-16 DIAGNOSIS — M5417 Radiculopathy, lumbosacral region: Secondary | ICD-10-CM | POA: Diagnosis not present

## 2019-09-16 DIAGNOSIS — G501 Atypical facial pain: Secondary | ICD-10-CM | POA: Diagnosis not present

## 2019-09-16 DIAGNOSIS — R29898 Other symptoms and signs involving the musculoskeletal system: Secondary | ICD-10-CM | POA: Diagnosis not present

## 2019-09-16 DIAGNOSIS — G5603 Carpal tunnel syndrome, bilateral upper limbs: Secondary | ICD-10-CM | POA: Diagnosis not present

## 2019-09-16 DIAGNOSIS — M792 Neuralgia and neuritis, unspecified: Secondary | ICD-10-CM | POA: Diagnosis not present

## 2019-09-16 DIAGNOSIS — G603 Idiopathic progressive neuropathy: Secondary | ICD-10-CM | POA: Diagnosis not present

## 2019-09-30 DIAGNOSIS — G8929 Other chronic pain: Secondary | ICD-10-CM | POA: Diagnosis not present

## 2019-09-30 DIAGNOSIS — G894 Chronic pain syndrome: Secondary | ICD-10-CM | POA: Diagnosis not present

## 2019-09-30 DIAGNOSIS — M545 Low back pain: Secondary | ICD-10-CM | POA: Diagnosis not present

## 2019-09-30 DIAGNOSIS — M25561 Pain in right knee: Secondary | ICD-10-CM | POA: Diagnosis not present

## 2019-09-30 DIAGNOSIS — R519 Headache, unspecified: Secondary | ICD-10-CM | POA: Diagnosis not present

## 2019-10-28 DIAGNOSIS — R519 Headache, unspecified: Secondary | ICD-10-CM | POA: Diagnosis not present

## 2019-10-28 DIAGNOSIS — G894 Chronic pain syndrome: Secondary | ICD-10-CM | POA: Diagnosis not present

## 2019-10-28 DIAGNOSIS — M792 Neuralgia and neuritis, unspecified: Secondary | ICD-10-CM | POA: Diagnosis not present

## 2019-10-28 DIAGNOSIS — G501 Atypical facial pain: Secondary | ICD-10-CM | POA: Diagnosis not present

## 2019-11-09 DIAGNOSIS — H2512 Age-related nuclear cataract, left eye: Secondary | ICD-10-CM | POA: Diagnosis not present

## 2019-11-10 DIAGNOSIS — H2511 Age-related nuclear cataract, right eye: Secondary | ICD-10-CM | POA: Diagnosis not present

## 2019-12-30 DIAGNOSIS — G894 Chronic pain syndrome: Secondary | ICD-10-CM | POA: Diagnosis not present

## 2019-12-30 DIAGNOSIS — M545 Low back pain: Secondary | ICD-10-CM | POA: Diagnosis not present

## 2019-12-30 DIAGNOSIS — G501 Atypical facial pain: Secondary | ICD-10-CM | POA: Diagnosis not present

## 2019-12-30 DIAGNOSIS — R519 Headache, unspecified: Secondary | ICD-10-CM | POA: Diagnosis not present

## 2020-03-23 DIAGNOSIS — G501 Atypical facial pain: Secondary | ICD-10-CM | POA: Diagnosis not present

## 2020-03-23 DIAGNOSIS — G894 Chronic pain syndrome: Secondary | ICD-10-CM | POA: Diagnosis not present

## 2020-03-23 DIAGNOSIS — R519 Headache, unspecified: Secondary | ICD-10-CM | POA: Diagnosis not present

## 2020-04-15 ENCOUNTER — Ambulatory Visit: Payer: Medicare Other | Attending: Internal Medicine

## 2020-04-15 DIAGNOSIS — Z23 Encounter for immunization: Secondary | ICD-10-CM

## 2020-04-15 NOTE — Progress Notes (Signed)
   Covid-19 Vaccination Clinic  Name:  Joel Hart    MRN: [id number] DOB: 07/06/1955  04/15/2020  Mr. Kolodny was observed post Covid-19 immunization for 15 minutes without incident. He was provided with Vaccine Information Sheet and instruction to access the V-Safe system.   Mr. Rapozo was instructed to call 911 with any severe reactions post vaccine: Marland Kitchen Difficulty breathing  . Swelling of face and throat  . A fast heartbeat  . A bad rash all over body  . Dizziness and weakness   Immunizations Administered    Name Date Dose VIS Date Route   Pfizer COVID-19 Vaccine 04/15/2020  2:39 PM 0.3 mL 03/02/2020 Intramuscular   Manufacturer: [place]   Lot: [id number]   [place]: [address]-4

## 2020-05-18 DIAGNOSIS — Z79899 Other long term (current) drug therapy: Secondary | ICD-10-CM | POA: Diagnosis not present

## 2020-05-18 DIAGNOSIS — M792 Neuralgia and neuritis, unspecified: Secondary | ICD-10-CM | POA: Diagnosis not present

## 2020-05-18 DIAGNOSIS — R519 Headache, unspecified: Secondary | ICD-10-CM | POA: Diagnosis not present

## 2020-05-18 DIAGNOSIS — Z5181 Encounter for therapeutic drug level monitoring: Secondary | ICD-10-CM | POA: Diagnosis not present

## 2020-05-18 DIAGNOSIS — G8929 Other chronic pain: Secondary | ICD-10-CM | POA: Diagnosis not present

## 2020-05-18 DIAGNOSIS — G501 Atypical facial pain: Secondary | ICD-10-CM | POA: Diagnosis not present

## 2020-06-13 DIAGNOSIS — F32A Depression, unspecified: Secondary | ICD-10-CM | POA: Diagnosis not present

## 2020-06-13 DIAGNOSIS — R410 Disorientation, unspecified: Secondary | ICD-10-CM | POA: Diagnosis not present

## 2020-06-13 DIAGNOSIS — R5382 Chronic fatigue, unspecified: Secondary | ICD-10-CM | POA: Diagnosis not present

## 2020-06-13 DIAGNOSIS — R413 Other amnesia: Secondary | ICD-10-CM | POA: Diagnosis not present

## 2020-06-22 DIAGNOSIS — R410 Disorientation, unspecified: Secondary | ICD-10-CM | POA: Diagnosis not present

## 2020-06-22 DIAGNOSIS — R42 Dizziness and giddiness: Secondary | ICD-10-CM | POA: Diagnosis not present

## 2020-06-30 DIAGNOSIS — R413 Other amnesia: Secondary | ICD-10-CM | POA: Diagnosis not present

## 2020-06-30 DIAGNOSIS — R410 Disorientation, unspecified: Secondary | ICD-10-CM | POA: Diagnosis not present

## 2020-07-20 DIAGNOSIS — G894 Chronic pain syndrome: Secondary | ICD-10-CM | POA: Diagnosis not present

## 2020-07-20 DIAGNOSIS — M5416 Radiculopathy, lumbar region: Secondary | ICD-10-CM | POA: Diagnosis not present

## 2020-09-13 DIAGNOSIS — F32A Depression, unspecified: Secondary | ICD-10-CM | POA: Diagnosis not present

## 2020-09-13 DIAGNOSIS — R410 Disorientation, unspecified: Secondary | ICD-10-CM | POA: Diagnosis not present

## 2020-09-13 DIAGNOSIS — R413 Other amnesia: Secondary | ICD-10-CM | POA: Diagnosis not present

## 2020-09-15 DIAGNOSIS — H5712 Ocular pain, left eye: Secondary | ICD-10-CM | POA: Diagnosis not present

## 2020-10-12 DIAGNOSIS — M25551 Pain in right hip: Secondary | ICD-10-CM | POA: Diagnosis not present

## 2020-10-12 DIAGNOSIS — R519 Headache, unspecified: Secondary | ICD-10-CM | POA: Diagnosis not present

## 2020-10-12 DIAGNOSIS — G501 Atypical facial pain: Secondary | ICD-10-CM | POA: Diagnosis not present

## 2020-10-12 DIAGNOSIS — M792 Neuralgia and neuritis, unspecified: Secondary | ICD-10-CM | POA: Diagnosis not present

## 2020-10-12 DIAGNOSIS — Z79891 Long term (current) use of opiate analgesic: Secondary | ICD-10-CM | POA: Diagnosis not present

## 2020-10-12 DIAGNOSIS — M25552 Pain in left hip: Secondary | ICD-10-CM | POA: Diagnosis not present

## 2020-10-12 DIAGNOSIS — G8929 Other chronic pain: Secondary | ICD-10-CM | POA: Diagnosis not present

## 2020-10-12 DIAGNOSIS — M5441 Lumbago with sciatica, right side: Secondary | ICD-10-CM | POA: Diagnosis not present

## 2020-10-12 DIAGNOSIS — G894 Chronic pain syndrome: Secondary | ICD-10-CM | POA: Diagnosis not present

## 2020-10-12 DIAGNOSIS — M5442 Lumbago with sciatica, left side: Secondary | ICD-10-CM | POA: Diagnosis not present

## 2020-12-21 DIAGNOSIS — G8929 Other chronic pain: Secondary | ICD-10-CM | POA: Diagnosis not present

## 2020-12-21 DIAGNOSIS — Z79891 Long term (current) use of opiate analgesic: Secondary | ICD-10-CM | POA: Diagnosis not present

## 2020-12-21 DIAGNOSIS — M5441 Lumbago with sciatica, right side: Secondary | ICD-10-CM | POA: Diagnosis not present

## 2020-12-21 DIAGNOSIS — M5442 Lumbago with sciatica, left side: Secondary | ICD-10-CM | POA: Diagnosis not present

## 2020-12-21 DIAGNOSIS — G894 Chronic pain syndrome: Secondary | ICD-10-CM | POA: Diagnosis not present

## 2020-12-21 DIAGNOSIS — G501 Atypical facial pain: Secondary | ICD-10-CM | POA: Diagnosis not present

## 2020-12-21 DIAGNOSIS — Z5181 Encounter for therapeutic drug level monitoring: Secondary | ICD-10-CM | POA: Diagnosis not present

## 2020-12-21 DIAGNOSIS — Z79899 Other long term (current) drug therapy: Secondary | ICD-10-CM | POA: Diagnosis not present

## 2021-01-17 DIAGNOSIS — M5416 Radiculopathy, lumbar region: Secondary | ICD-10-CM | POA: Diagnosis not present

## 2021-01-17 DIAGNOSIS — M5126 Other intervertebral disc displacement, lumbar region: Secondary | ICD-10-CM | POA: Diagnosis not present

## 2021-01-17 DIAGNOSIS — M5186 Other intervertebral disc disorders, lumbar region: Secondary | ICD-10-CM | POA: Diagnosis not present

## 2021-01-17 DIAGNOSIS — M4316 Spondylolisthesis, lumbar region: Secondary | ICD-10-CM | POA: Diagnosis not present

## 2021-01-17 DIAGNOSIS — M5136 Other intervertebral disc degeneration, lumbar region: Secondary | ICD-10-CM | POA: Diagnosis not present

## 2021-01-17 DIAGNOSIS — M450 Ankylosing spondylitis of multiple sites in spine: Secondary | ICD-10-CM | POA: Diagnosis not present

## 2021-01-17 DIAGNOSIS — M48061 Spinal stenosis, lumbar region without neurogenic claudication: Secondary | ICD-10-CM | POA: Diagnosis not present

## 2021-01-31 ENCOUNTER — Encounter: Payer: Self-pay | Admitting: Family Medicine

## 2021-02-01 DIAGNOSIS — M5481 Occipital neuralgia: Secondary | ICD-10-CM | POA: Diagnosis not present

## 2021-02-01 DIAGNOSIS — Z008 Encounter for other general examination: Secondary | ICD-10-CM | POA: Diagnosis not present

## 2021-02-01 DIAGNOSIS — Z0000 Encounter for general adult medical examination without abnormal findings: Secondary | ICD-10-CM | POA: Diagnosis not present

## 2021-02-01 DIAGNOSIS — I10 Essential (primary) hypertension: Secondary | ICD-10-CM | POA: Diagnosis not present

## 2021-02-02 ENCOUNTER — Ambulatory Visit: Payer: Medicare Other | Admitting: Family Medicine

## 2021-08-16 ENCOUNTER — Encounter (HOSPITAL_BASED_OUTPATIENT_CLINIC_OR_DEPARTMENT_OTHER): Payer: Self-pay | Admitting: Emergency Medicine

## 2021-08-16 ENCOUNTER — Other Ambulatory Visit: Payer: Self-pay

## 2021-08-16 ENCOUNTER — Emergency Department (HOSPITAL_BASED_OUTPATIENT_CLINIC_OR_DEPARTMENT_OTHER)
Admission: EM | Admit: 2021-08-16 | Discharge: 2021-08-16 | Disposition: A | Discharge status: Home / Self Care | Payer: Medicare Other | Attending: Emergency Medicine | Admitting: Emergency Medicine

## 2021-08-16 DIAGNOSIS — F419 Anxiety disorder, unspecified: Secondary | ICD-10-CM | POA: Insufficient documentation

## 2021-08-16 DIAGNOSIS — Z760 Encounter for issue of repeat prescription: Secondary | ICD-10-CM | POA: Insufficient documentation

## 2021-08-16 MED ORDER — CLONAZEPAM 2 MG PO TABS: 2.0000 mg | ORAL_TABLET | Freq: Two times a day (BID) | ORAL | 0 refills | Status: DC

## 2021-08-16 NOTE — Discharge Instructions (Addendum)
Please pick up medication and take as prescribed for the next 3 days. It is VERY important to you follow up with [facility] for further evaluation/medication needs. They are open 24/7 on a walk in basis.  ?

## 2021-08-16 NOTE — ED Triage Notes (Signed)
Hx anxiety , out of meds , needs prescription , has [facility] care at [facility] .  ?Reports left  facial pain and upper and lower teeth pain  ?

## 2021-08-16 NOTE — ED Provider Notes (Signed)
?[place] EMERGENCY DEPARTMENT ?Provider Note ? ? ?CSN: [id number] ?Arrival date & time: 08/16/21  1410 ? ?  ? ?History ? ?Chief Complaint  ?Patient presents with  ? Anxiety  ? ? ?Joel Hart is a 66 y.o. male with PMHx GAD, Trigeminal neuralgia, and Occipital neuralgia who presents to the ED today with complaint of anxiety. Pt reports he has been on a total of 4 mg Klonopin daily for the past 32 years. He states he was seen at [facility] last Wednesday for same. He reports when he left the office he went to pick up his prescription and noticed that the Klonopin was prescribed in a way to taper him off. He states he was not made aware of this during the office visit. He states he has been trying to take the medication as prescribed however feels like his anxiety is through the roof. He also reports his trigeminal neuralgia is acting up due to same and he cannot go on like this. He denies specific SI, HI, or AVH at this time.  ? ?Per chart review: ?Pt with hx of alcohol use - drinks 2 beers a night on average as well as occasional marijuana use. This violated pt's controlled substance contract and he was ultimately discharged from the facility however was given a months worth of Klonopin taper for same.  ? ? ?The history is provided by the patient and medical records.  ? ?  ? ?Home Medications ?Prior to Admission medications   ?Medication Sig Start Date End Date Taking? Authorizing Provider  ?clonazePAM (KLONOPIN) 2 MG tablet Take 1 tablet (2 mg total) by mouth 2 (two) times daily for 3 days. 08/16/21 08/19/21 Yes Tokiko Diefenderfer, PA-C  ?clonazePAM (KLONOPIN) 2 MG tablet TAKE 1 TABLET (2 MG) BY MOUTH 2 TIMES DAILY PRN FOR ANXIETY. DO NOT TAKE IN COMBINATION WITH OPIOID PAIN MEDICATION(S). 10/30/17   [provider]  ?cloNIDine (CATAPRES) 0.2 MG tablet Take 1 tablet by mouth at bedtime. 01/28/17   [provider]  ?Cyanocobalamin (VITAMIN B-12 PO) Take 1 tablet by mouth daily.    [provider]  ?folic acid (FOLVITE) 1 MG tablet Take 1 mg by mouth daily. 12/17/18   [provider]  ?gabapentin (NEURONTIN) 800 MG tablet Take 1 tablet by mouth 4 (four) times daily. 02/03/19   [provider]  ?gabapentin (NEURONTIN) 800 MG tablet Take by mouth. 08/17/19   [provider]  ?morphine (MS CONTIN) 30 MG 12 hr tablet Take 1 tablet by mouth 3 (three) times daily. 02/06/19   [provider]  ?PARoxetine (PAXIL) 30 MG tablet Take 30 mg by mouth daily. 02/08/19   [provider]  ?tiZANidine (ZANAFLEX) 4 MG tablet Take 4 mg by mouth every 12 (twelve) hours as needed. 02/13/19   [provider]  ?verapamil (COVERA HS) 240 MG (CO) 24 hr tablet Take 240 mg by mouth at bedtime.    [provider]  ?   ? ?Allergies    ?Suboxone  [buprenorphine hcl-naloxone hcl] and Mirtazapine   ? ?Review of Systems   ?Review of Systems  ?Constitutional:  Negative for chills and fever.  ?Psychiatric/Behavioral:  Negative for hallucinations and suicidal ideas. The patient is nervous/anxious.   ?All other systems reviewed and are negative. ? ?Physical Exam ?Updated Vital Signs ?BP (!) 166/92 (BP Location: Left Arm)   Pulse 74   Temp 98 ?F (36.7 ?C) (Oral)   Resp 18   Ht '5\' 7"'$  (  1.702 m)   Wt 93 kg   SpO2 98%   BMI 32.11 kg/m?  ?Physical Exam ?Vitals and nursing note reviewed.  ?Constitutional:   ?   Appearance: He is not ill-appearing.  ?   Comments: Tearful  ?HENT:  ?   Head: Normocephalic and atraumatic.  ?Eyes:  ?   Conjunctiva/sclera: Conjunctivae normal.  ?Cardiovascular:  ?   Rate and Rhythm: Normal rate and regular rhythm.  ?Pulmonary:  ?   Effort: Pulmonary effort is normal.  ?   Breath sounds: Normal breath sounds.  ?Skin: ?   General: Skin is warm and dry.  ?   Coloration: Skin is not jaundiced.  ?Neurological:  ?   Mental Status: He is alert.  ? ? ?ED Results / Procedures / Treatments   ?Labs ?(all labs ordered are listed, but only abnormal results are  displayed) ?Labs Reviewed - No data to display ? ?EKG ?None ? ?Radiology ?No results found. ? ?Procedures ?Procedures  ? ? ?Medications Ordered in ED ?Medications - No data to display ? ?ED Course/ Medical Decision Making/ A&P ?  ?                        ?Medical Decision Making ?66 year old male who presents to the ED today for medication refill. He has been discharged from his previous [facility] clinic last week with Rx Klonopin x 1 month for tapering purposes. Experiencing worsening anxiety due to same and here for medication assistance - pt typically on 2 mg Klonpin BID (for the past ~ 30 years). On arrival to the ED VSS. Pt is tearful on exam. He reports anxiety is worsening his known trigeminal and occipital neuralgia. It does appear he was let go from his previous clinic due to violating their controlled substance contract (hx of alcohol use daily and marijuana use occasionally) on top of Morphine for chronic pain and Gabapentin. Pt requesting medication refill x ~ 1- 1.5 months as he plans to have neck surgery soon and feels like he will not need the pain medication afterwards which will help him reduce his Klonopin dose. I had lengthy discussion with pt regarding the fact that I am unable to write a controlled substance prescription for that long and he will need to follow up with a psychiatrist for same. He has been provided [facility] information for follow up. Discussed case with attending physician Dr. Melina Copa who is in agreement to prescribe pt a couple of days worth of medication - pt discharged with 3 days worth. He denies any SI, HI, or AVH and does not require additional workup in the ED at this time. Pt stable for discharge otherwise.  ? ?Problems Addressed: ?Anxiety: acute illness or injury ?Medication refill: acute illness or injury ? ?Risk ?Prescription drug management. ? ? ? ? ? ? ? ? ? ?Final Clinical Impression(s) / ED Diagnoses ?Final diagnoses:  ?Anxiety  ?Medication refill  ? ? ?Rx / DC Orders ?ED  Discharge Orders   ? ?      Ordered  ?  clonazePAM (KLONOPIN) 2 MG tablet  2 times daily       ? 08/16/21 1611  ? ?  ?  ? ?  ? ? ? ?Discharge Instructions   ? ?  ?Please pick up medication and take as prescribed for the next 3 days. It is VERY important to you follow up with [facility] for further evaluation/medication needs. They are open 24/7 on  a walk in basis.  ? ? ? ? ?  ?Eustaquio Maize, PA-C ?08/16/21 1617 ? ?  ?Hayden Rasmussen, MD ?08/17/21 [phone] ? ?

## 2021-12-19 ENCOUNTER — Emergency Department (HOSPITAL_COMMUNITY): Payer: Medicare Other

## 2021-12-19 ENCOUNTER — Encounter (HOSPITAL_COMMUNITY): Payer: Self-pay

## 2021-12-19 ENCOUNTER — Inpatient Hospital Stay (HOSPITAL_COMMUNITY)
Admission: EM | Admit: 2021-12-19 | Discharge: 2021-12-24 | DRG: 682 | Discharge status: Against Medical Advice | Payer: Medicare Other | Attending: Internal Medicine | Admitting: Internal Medicine

## 2021-12-19 ENCOUNTER — Other Ambulatory Visit: Payer: Self-pay

## 2021-12-19 DIAGNOSIS — L0211 Cutaneous abscess of neck: Secondary | ICD-10-CM | POA: Diagnosis present

## 2021-12-19 DIAGNOSIS — E875 Hyperkalemia: Secondary | ICD-10-CM | POA: Diagnosis present

## 2021-12-19 DIAGNOSIS — I214 Non-ST elevation (NSTEMI) myocardial infarction: Secondary | ICD-10-CM | POA: Diagnosis present

## 2021-12-19 DIAGNOSIS — I159 Secondary hypertension, unspecified: Secondary | ICD-10-CM | POA: Diagnosis not present

## 2021-12-19 DIAGNOSIS — Z8249 Family history of ischemic heart disease and other diseases of the circulatory system: Secondary | ICD-10-CM

## 2021-12-19 DIAGNOSIS — G894 Chronic pain syndrome: Secondary | ICD-10-CM | POA: Diagnosis present

## 2021-12-19 DIAGNOSIS — J189 Pneumonia, unspecified organism: Secondary | ICD-10-CM | POA: Diagnosis present

## 2021-12-19 DIAGNOSIS — L03221 Cellulitis of neck: Secondary | ICD-10-CM | POA: Diagnosis present

## 2021-12-19 DIAGNOSIS — J811 Chronic pulmonary edema: Secondary | ICD-10-CM | POA: Diagnosis present

## 2021-12-19 DIAGNOSIS — E781 Pure hyperglyceridemia: Secondary | ICD-10-CM | POA: Diagnosis present

## 2021-12-19 DIAGNOSIS — N179 Acute kidney failure, unspecified: Principal | ICD-10-CM

## 2021-12-19 DIAGNOSIS — Z8547 Personal history of malignant neoplasm of testis: Secondary | ICD-10-CM

## 2021-12-19 DIAGNOSIS — N19 Unspecified kidney failure: Secondary | ICD-10-CM

## 2021-12-19 DIAGNOSIS — I509 Heart failure, unspecified: Secondary | ICD-10-CM | POA: Diagnosis not present

## 2021-12-19 DIAGNOSIS — G500 Trigeminal neuralgia: Secondary | ICD-10-CM | POA: Diagnosis present

## 2021-12-19 DIAGNOSIS — G4733 Obstructive sleep apnea (adult) (pediatric): Secondary | ICD-10-CM | POA: Diagnosis present

## 2021-12-19 DIAGNOSIS — F4310 Post-traumatic stress disorder, unspecified: Secondary | ICD-10-CM | POA: Diagnosis present

## 2021-12-19 DIAGNOSIS — E871 Hypo-osmolality and hyponatremia: Secondary | ICD-10-CM | POA: Diagnosis present

## 2021-12-19 DIAGNOSIS — I130 Hypertensive heart and chronic kidney disease with heart failure and stage 1 through stage 4 chronic kidney disease, or unspecified chronic kidney disease: Secondary | ICD-10-CM | POA: Diagnosis present

## 2021-12-19 DIAGNOSIS — T402X5A Adverse effect of other opioids, initial encounter: Secondary | ICD-10-CM | POA: Diagnosis present

## 2021-12-19 DIAGNOSIS — I9589 Other hypotension: Secondary | ICD-10-CM | POA: Diagnosis present

## 2021-12-19 DIAGNOSIS — D649 Anemia, unspecified: Secondary | ICD-10-CM | POA: Diagnosis present

## 2021-12-19 DIAGNOSIS — N184 Chronic kidney disease, stage 4 (severe): Secondary | ICD-10-CM | POA: Diagnosis present

## 2021-12-19 DIAGNOSIS — I249 Acute ischemic heart disease, unspecified: Secondary | ICD-10-CM | POA: Diagnosis present

## 2021-12-19 DIAGNOSIS — J810 Acute pulmonary edema: Secondary | ICD-10-CM

## 2021-12-19 DIAGNOSIS — E785 Hyperlipidemia, unspecified: Secondary | ICD-10-CM | POA: Diagnosis present

## 2021-12-19 DIAGNOSIS — I5033 Acute on chronic diastolic (congestive) heart failure: Secondary | ICD-10-CM | POA: Diagnosis present

## 2021-12-19 DIAGNOSIS — Z781 Physical restraint status: Secondary | ICD-10-CM

## 2021-12-19 DIAGNOSIS — I50810 Right heart failure, unspecified: Secondary | ICD-10-CM | POA: Diagnosis not present

## 2021-12-19 DIAGNOSIS — Z66 Do not resuscitate: Secondary | ICD-10-CM | POA: Diagnosis present

## 2021-12-19 DIAGNOSIS — F418 Other specified anxiety disorders: Secondary | ICD-10-CM | POA: Diagnosis present

## 2021-12-19 DIAGNOSIS — I10 Essential (primary) hypertension: Secondary | ICD-10-CM | POA: Diagnosis not present

## 2021-12-19 DIAGNOSIS — G928 Other toxic encephalopathy: Secondary | ICD-10-CM | POA: Diagnosis present

## 2021-12-19 DIAGNOSIS — G253 Myoclonus: Secondary | ICD-10-CM | POA: Diagnosis present

## 2021-12-19 DIAGNOSIS — Z981 Arthrodesis status: Secondary | ICD-10-CM

## 2021-12-19 HISTORY — DX: Pure hyperglyceridemia: E78.1

## 2021-12-19 LAB — CBC
HCT: 32.8 % — ABNORMAL LOW (ref 39.0–52.0)
Hemoglobin: 10.8 g/dL — ABNORMAL LOW (ref 13.0–17.0)
MCH: 32.4 pg (ref 26.0–34.0)
MCHC: 32.9 g/dL (ref 30.0–36.0)
MCV: 98.5 fL (ref 80.0–100.0)
Platelets: 366 10*3/uL (ref 150–400)
RBC: 3.33 MIL/uL — ABNORMAL LOW (ref 4.22–5.81)
RDW: 13 % (ref 11.5–15.5)
WBC: 14.2 10*3/uL — ABNORMAL HIGH (ref 4.0–10.5)
nRBC: 0 % (ref 0.0–0.2)

## 2021-12-19 LAB — COMPREHENSIVE METABOLIC PANEL
ALT: 24 U/L (ref 0–44)
AST: 39 U/L (ref 15–41)
Albumin: 3.5 g/dL (ref 3.5–5.0)
Alkaline Phosphatase: 70 U/L (ref 38–126)
Anion gap: 13 (ref 5–15)
BUN: 59 mg/dL — ABNORMAL HIGH (ref 8–23)
CO2: 20 mmol/L — ABNORMAL LOW (ref 22–32)
Calcium: 8 mg/dL — ABNORMAL LOW (ref 8.9–10.3)
Chloride: 100 mmol/L (ref 98–111)
Creatinine, Ser: 4.73 mg/dL — ABNORMAL HIGH (ref 0.61–1.24)
GFR, Estimated: 13 mL/min — ABNORMAL LOW (ref >60)
Glucose, Bld: 164 mg/dL — ABNORMAL HIGH (ref 70–99)
Potassium: 6.4 mmol/L (ref 3.5–5.1)
Sodium: 133 mmol/L — ABNORMAL LOW (ref 135–145)
Total Bilirubin: 0.5 mg/dL (ref 0.3–1.2)
Total Protein: 6.4 g/dL — ABNORMAL LOW (ref 6.5–8.1)

## 2021-12-19 LAB — I-STAT CHEM 8, ED
BUN: 70 mg/dL — ABNORMAL HIGH (ref 8–23)
Calcium, Ion: 0.94 mmol/L — ABNORMAL LOW (ref 1.15–1.40)
Chloride: 101 mmol/L (ref 98–111)
Creatinine, Ser: 5.1 mg/dL — ABNORMAL HIGH (ref 0.61–1.24)
Glucose, Bld: 156 mg/dL — ABNORMAL HIGH (ref 70–99)
HCT: 32 % — ABNORMAL LOW (ref 39.0–52.0)
Hemoglobin: 10.9 g/dL — ABNORMAL LOW (ref 13.0–17.0)
Potassium: 6.2 mmol/L — ABNORMAL HIGH (ref 3.5–5.1)
Sodium: 131 mmol/L — ABNORMAL LOW (ref 135–145)
TCO2: 21 mmol/L — ABNORMAL LOW (ref 22–32)

## 2021-12-19 LAB — I-STAT VENOUS BLOOD GAS, ED
Acid-base deficit: 4 mmol/L — ABNORMAL HIGH (ref 0.0–2.0)
Bicarbonate: 20.1 mmol/L (ref 20.0–28.0)
Calcium, Ion: 0.9 mmol/L — ABNORMAL LOW (ref 1.15–1.40)
HCT: 34 % — ABNORMAL LOW (ref 39.0–52.0)
Hemoglobin: 11.6 g/dL — ABNORMAL LOW (ref 13.0–17.0)
O2 Saturation: 96 %
Potassium: 6.1 mmol/L — ABNORMAL HIGH (ref 3.5–5.1)
Sodium: 131 mmol/L — ABNORMAL LOW (ref 135–145)
TCO2: 21 mmol/L — ABNORMAL LOW (ref 22–32)
pCO2, Ven: 34.4 mmHg — ABNORMAL LOW (ref 44–60)
pH, Ven: 7.375 (ref 7.25–7.43)
pO2, Ven: 86 mmHg — ABNORMAL HIGH (ref 32–45)

## 2021-12-19 LAB — CBG MONITORING, ED
Glucose-Capillary: 149 mg/dL — ABNORMAL HIGH (ref 70–99)
Glucose-Capillary: 135 mg/dL — ABNORMAL HIGH (ref 70–99)

## 2021-12-19 LAB — DIFFERENTIAL
Abs Immature Granulocytes: 0.06 10*3/uL (ref 0.00–0.07)
Basophils Absolute: 0 10*3/uL (ref 0.0–0.1)
Basophils Relative: 0 %
Eosinophils Absolute: 0 10*3/uL (ref 0.0–0.5)
Eosinophils Relative: 0 %
Immature Granulocytes: 0 %
Lymphocytes Relative: 4 %
Lymphs Abs: 0.6 10*3/uL — ABNORMAL LOW (ref 0.7–4.0)
Monocytes Absolute: 0.7 10*3/uL (ref 0.1–1.0)
Monocytes Relative: 5 %
Neutro Abs: 12.9 10*3/uL — ABNORMAL HIGH (ref 1.7–7.7)
Neutrophils Relative %: 91 %

## 2021-12-19 LAB — APTT: aPTT: 23 seconds — ABNORMAL LOW (ref 24–36)

## 2021-12-19 LAB — CK: Total CK: 275 U/L (ref 49–397)

## 2021-12-19 LAB — BRAIN NATRIURETIC PEPTIDE: B Natriuretic Peptide: 1080.8 pg/mL — ABNORMAL HIGH (ref 0.0–100.0)

## 2021-12-19 LAB — AMMONIA: Ammonia: 25 umol/L (ref 9–35)

## 2021-12-19 LAB — PROTIME-INR
INR: 1.2 (ref 0.8–1.2)
Prothrombin Time: 14.9 seconds (ref 11.4–15.2)

## 2021-12-19 LAB — ETHANOL: Alcohol, Ethyl (B): <10 mg/dL (ref <10)

## 2021-12-19 MED ORDER — CLONIDINE HCL 0.2 MG PO TABS
0.2000 mg | ORAL_TABLET | Freq: Every day | ORAL | Status: DC
Start: 2021-12-19 — End: 2021-12-19

## 2021-12-19 MED ORDER — PAROXETINE HCL 10 MG PO TABS
30.0000 mg | ORAL_TABLET | Freq: Every day | ORAL | Status: DC
  Administered 2021-12-21 – 2021-12-24 (×4): 30 mg via ORAL
  Filled 2021-12-19: qty 1
  Filled 2021-12-19: qty 3
  Filled 2021-12-19 (×3): qty 1

## 2021-12-19 MED ORDER — TIZANIDINE HCL 4 MG PO TABS
4.0000 mg | ORAL_TABLET | Freq: Two times a day (BID) | ORAL | Status: DC | PRN
  Administered 2021-12-22 – 2021-12-23 (×3): 4 mg via ORAL
  Filled 2021-12-19 (×5): qty 1

## 2021-12-19 MED ORDER — CALCIUM GLUCONATE-NACL 1-0.675 GM/50ML-% IV SOLN
1.0000 g | Freq: Once | INTRAVENOUS | Status: AC
  Administered 2021-12-19: 1000 mg via INTRAVENOUS
  Filled 2021-12-19: qty 50

## 2021-12-19 MED ORDER — GABAPENTIN 300 MG PO CAPS
300.0000 mg | ORAL_CAPSULE | Freq: Three times a day (TID) | ORAL | Status: DC
  Administered 2021-12-20 – 2021-12-24 (×12): 300 mg via ORAL
  Filled 2021-12-19 (×12): qty 1

## 2021-12-19 MED ORDER — AMLODIPINE BESYLATE 5 MG PO TABS: 10.0000 mg | ORAL_TABLET | Freq: Every day | ORAL | Status: DC

## 2021-12-19 MED ORDER — FOLIC ACID 1 MG PO TABS
1.0000 mg | ORAL_TABLET | Freq: Every day | ORAL | Status: DC
  Administered 2021-12-21 – 2021-12-24 (×4): 1 mg via ORAL
  Filled 2021-12-19 (×4): qty 1

## 2021-12-19 MED ORDER — SENNA 8.6 MG PO TABS
1.0000 | ORAL_TABLET | Freq: Two times a day (BID) | ORAL | Status: DC
  Administered 2021-12-20 – 2021-12-24 (×9): 8.6 mg via ORAL
  Filled 2021-12-19 (×9): qty 1

## 2021-12-19 MED ORDER — HEPARIN SODIUM (PORCINE) 5000 UNIT/ML IJ SOLN
5000.0000 [IU] | Freq: Three times a day (TID) | INTRAMUSCULAR | Status: DC
Start: 2021-12-19 — End: 2021-12-20
  Administered 2021-12-20: 5000 [IU] via SUBCUTANEOUS
  Filled 2021-12-19: qty 1

## 2021-12-19 MED ORDER — LACTATED RINGERS IV BOLUS: 2000.0000 mL | Freq: Once | INTRAVENOUS | Status: DC

## 2021-12-19 MED ORDER — SODIUM CHLORIDE 0.45 % IV SOLN: INTRAVENOUS | Status: DC

## 2021-12-19 MED ORDER — FUROSEMIDE 10 MG/ML IJ SOLN
40.0000 mg | Freq: Four times a day (QID) | INTRAMUSCULAR | Status: DC
  Administered 2021-12-20 (×3): 40 mg via INTRAVENOUS
  Filled 2021-12-19 (×3): qty 4

## 2021-12-19 MED ORDER — VANCOMYCIN HCL 2000 MG/400ML IV SOLN
2000.0000 mg | Freq: Once | INTRAVENOUS | Status: AC
  Administered 2021-12-20: 2000 mg via INTRAVENOUS
  Filled 2021-12-19: qty 400

## 2021-12-19 MED ORDER — LACTATED RINGERS IV SOLN: INTRAVENOUS | Status: DC

## 2021-12-19 MED ORDER — VERAPAMIL HCL 240 MG (CO) PO TB24
240.0000 mg | ORAL_TABLET | Freq: Every day | ORAL | Status: DC
Start: 2021-12-19 — End: 2021-12-19

## 2021-12-19 MED ORDER — SODIUM ZIRCONIUM CYCLOSILICATE 10 G PO PACK
10.0000 g | PACK | Freq: Once | ORAL | Status: AC
  Administered 2021-12-20: 10 g via ORAL
  Filled 2021-12-19: qty 1

## 2021-12-19 MED ORDER — DEXTROSE 50 % IV SOLN
1.0000 | Freq: Once | INTRAVENOUS | Status: AC
  Administered 2021-12-19: 50 mL via INTRAVENOUS
  Filled 2021-12-19: qty 50

## 2021-12-19 MED ORDER — MORPHINE SULFATE ER 30 MG PO TBCR
30.0000 mg | EXTENDED_RELEASE_TABLET | Freq: Three times a day (TID) | ORAL | Status: DC
Start: 2021-12-19 — End: 2021-12-19

## 2021-12-19 MED ORDER — SODIUM CHLORIDE 0.9% FLUSH: 3.0000 mL | Freq: Once | INTRAVENOUS | Status: DC

## 2021-12-19 MED ORDER — SODIUM BICARBONATE 8.4 % IV SOLN
50.0000 meq | Freq: Once | INTRAVENOUS | Status: AC
  Administered 2021-12-19: 50 meq via INTRAVENOUS
  Filled 2021-12-19: qty 50

## 2021-12-19 MED ORDER — MORPHINE SULFATE 15 MG PO TABS: 15.0000 mg | ORAL_TABLET | Freq: Four times a day (QID) | ORAL | Status: DC | PRN

## 2021-12-19 MED ORDER — INSULIN ASPART 100 UNIT/ML IV SOLN
5.0000 [IU] | Freq: Once | INTRAVENOUS | Status: AC
  Administered 2021-12-19: 5 [IU] via INTRAVENOUS

## 2021-12-19 MED ORDER — SODIUM CHLORIDE 0.9 % IV BOLUS
2000.0000 mL | Freq: Once | INTRAVENOUS | Status: AC
  Administered 2021-12-19: 2000 mL via INTRAVENOUS

## 2021-12-19 MED ORDER — VITAMIN B-12 1000 MCG PO TABS
1000.0000 ug | ORAL_TABLET | Freq: Every day | ORAL | Status: DC
  Administered 2021-12-21 – 2021-12-24 (×4): 1000 ug via ORAL
  Filled 2021-12-19 (×4): qty 1

## 2021-12-19 MED ORDER — CLONAZEPAM 0.5 MG PO TABS
2.0000 mg | ORAL_TABLET | Freq: Two times a day (BID) | ORAL | Status: DC | PRN
  Administered 2021-12-20 – 2021-12-24 (×6): 2 mg via ORAL
  Filled 2021-12-19 (×6): qty 4

## 2021-12-19 NOTE — Assessment & Plan Note (Signed)
Patient denies any cardiac history. Denies chest pain and denies acute SOB. Wife reports he has had a cough and is sedentary. CXR and CT reveal Air space disease strongly suggestive of pulmonary edema. BNP elevated to 1,080. EKG w/o acute changes. Patient with hypoxemia requiring 100% rebreather facemask  Plan Cycle troponins  Lasix 40 mg IV q 6  Complete echocardiagram  Addendum: Troponin #1 974  Plan Full dose IV heparin ordered via pharmacy consult  Request for cardiology consult  2nd set Troponins ordered.
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
## 2021-12-19 NOTE — ED Notes (Signed)
Lab to add on BNP.  °

## 2021-12-19 NOTE — Consult Note (Signed)
Neurology Consultation  Reason for Consult: Code stroke-slurred speech, abnormal speech, vomiting, question left gaze-LVO 4 on EMS assessment Referring Physician: Dr. Kathrynn Humble  CC: Slurred speech abnormal speech, vomiting, question of left gaze, positive LVO screen in the field  History is obtained from: Patient, chart  HPI: Quanell Loughney is a 66 y.o. male past medical history of anxiety, depression, trigeminal neuralgia, occipital neuralgia, sleep apnea, chronic pain on multiple pain medications as well as stimulators placed, presenting to the emergency room with last known well somewhere around 10:30 PM yesterday 12/18/2021 and this evening noted to be crawling under his bed.  There was initially a question of a cardiac arrest but no loss of pulses was reported by family or EMS.  EMS found the patient under his bed thinking that he is in [place], appearing confused with slurred speech and dried vomitus on his clothes.  His lungs also sounded congested.  The EMS crew also observed a possible leftward gaze preference-the field-of-view screen was positive and they activated a code stroke. The patient was completely conversant on arrival although he was saturating poorly without the aid of a nonrebreather. On my examination, NIH stroke scale was 1 for dysarthria.  Otherwise there was asterixis noted in all extremities versus poly myoclonus.   LKW: 10:30 PM 12/18/2021 IV thrombolysis given?: no, outside the window Premorbid modified Rankin scale (mRS): 2-mostly due to chronic pain ROS: Full ROS was performed and is negative except as noted in the HPI.   Past Medical History:  Diagnosis Date   Anxiety    Arthritis    Cancer ([place])    testicular   Depression    GERD (gastroesophageal reflux disease)    Hyperlipidemia    Hypertension    Sleep apnea    uses CPAP   Trigeminus neuralgia      No family history on file.   Social History:   reports that he has never smoked. He has never  used smokeless tobacco. He reports current alcohol use. He reports current drug use. Drug: Marijuana.  Medications  Current Facility-Administered Medications:    sodium chloride flush (NS) 0.9 % injection 3 mL, 3 mL, Intravenous, Once, Nanavati, Ankit, MD  Current Outpatient Medications:    clonazePAM (KLONOPIN) 2 MG tablet, TAKE 1 TABLET (2 MG) BY MOUTH 2 TIMES DAILY PRN FOR ANXIETY. DO NOT TAKE IN COMBINATION WITH OPIOID PAIN MEDICATION(S)., Disp: , Rfl:    clonazePAM (KLONOPIN) 2 MG tablet, Take 1 tablet (2 mg total) by mouth 2 (two) times daily for 3 days., Disp: 6 tablet, Rfl: 0   cloNIDine (CATAPRES) 0.2 MG tablet, Take 1 tablet by mouth at bedtime., Disp: , Rfl:    Cyanocobalamin (VITAMIN B-12 PO), Take 1 tablet by mouth daily., Disp: , Rfl:    folic acid (FOLVITE) 1 MG tablet, Take 1 mg by mouth daily., Disp: , Rfl:    gabapentin (NEURONTIN) 800 MG tablet, Take 1 tablet by mouth 4 (four) times daily., Disp: , Rfl:    gabapentin (NEURONTIN) 800 MG tablet, Take by mouth., Disp: , Rfl:    morphine (MS CONTIN) 30 MG 12 hr tablet, Take 1 tablet by mouth 3 (three) times daily., Disp: , Rfl:    PARoxetine (PAXIL) 30 MG tablet, Take 30 mg by mouth daily., Disp: , Rfl:    tiZANidine (ZANAFLEX) 4 MG tablet, Take 4 mg by mouth every 12 (twelve) hours as needed., Disp: , Rfl:    verapamil (COVERA HS) 240 MG (CO) 24 hr tablet,  Take 240 mg by mouth at bedtime., Disp: , Rfl:    Exam: Current vital signs: Wt 98.8 kg   SpO2 96%   BMI 34.11 kg/m  Vital signs in last 24 hours: SpO2:  [96 %] 96 % (08/08 2005) Weight:  [98.8 kg] 98.8 kg (08/08 2014) General: Awake alert in no distress HEENT: Normocephalic atraumatic Lungs: Scattered rales Cardiovascular: Regular rhythm Abdomen nondistended nontender Extremities warm well perfused Neurologic exam Awake alert oriented x3 Mildly dysarthric No evidence of aphasia Cranial nerves II to XII intact Motor examination with no drift in any of the 4  extremities.  There is asterixis versus poly myoclonus in all 4 extremities. Sensory exam: Intact to touch without extinction Coordination: No dysmetria NIH stroke scale-1 for dysarthria  Labs I have reviewed labs in epic and the results pertinent to this consultation are:  CBC    Component Value Date/Time   WBC 6.6 03/30/2019 1156   RBC 4.25 03/30/2019 1156   HGB 13.6 03/30/2019 1156   HCT 40.5 03/30/2019 1156   PLT 299.0 03/30/2019 1156   MCV 95.1 03/30/2019 1156   MCH 31.3 10/08/2013 1023   MCHC 33.5 03/30/2019 1156   RDW 13.1 03/30/2019 1156   LYMPHSABS 1.8 10/08/2013 1023   MONOABS 0.8 10/08/2013 1023   EOSABS 0.3 10/08/2013 1023   BASOSABS 0.1 10/08/2013 1023    CMP     Component Value Date/Time   NA 138 03/30/2019 1156   NA 139 04/30/2013 1622   K 4.4 03/30/2019 1156   K 4.5 04/30/2013 1622   CL 99 03/30/2019 1156   CL 104 04/30/2013 1622   CO2 29 03/30/2019 1156   CO2 29 04/30/2013 1622   GLUCOSE 83 03/30/2019 1156   GLUCOSE 109 (H) 04/30/2013 1622   BUN 9 03/30/2019 1156   BUN 15 04/30/2013 1622   CREATININE 1.03 03/30/2019 1156   CREATININE 1.33 (H) 04/30/2013 1622   CALCIUM 8.9 03/30/2019 1156   CALCIUM 9.4 04/30/2013 1622   PROT 6.3 03/30/2019 1156   PROT 7.0 04/30/2013 1622   ALBUMIN 4.1 03/30/2019 1156   ALBUMIN 3.7 04/30/2013 1622   AST 17 03/30/2019 1156   AST 31 04/30/2013 1622   ALT 14 03/30/2019 1156   ALT 34 04/30/2013 1622   ALKPHOS 82 03/30/2019 1156   ALKPHOS 52 04/30/2013 1622   BILITOT 0.3 03/30/2019 1156   BILITOT 0.3 04/30/2013 1622   GFRNONAA 54 (L) 10/08/2013 1023   GFRNONAA 58 (L) 04/30/2013 1622   GFRAA 63 (L) 10/08/2013 1023   GFRAA >60 04/30/2013 1622    Lipid Panel     Component Value Date/Time   CHOL 204 (H) 03/30/2019 1156   TRIG 262.0 (H) 03/30/2019 1156   HDL 51.10 03/30/2019 1156   CHOLHDL 4 03/30/2019 1156   VLDL 52.4 (H) 03/30/2019 1156   LDLDIRECT 121.0 03/30/2019 1156     Imaging I have reviewed the  images obtained:  CT-head-aspects 10.   Assessment:  66 year old with above past medical history as documented in HPI presenting with concerns for evaluation of confusion, slurred speech and vomiting.  His examination is essentially nonfocal with the only positive finding being dysarthria.  He is on multiple medications that can cause altered mental status/toxic metabolic encephalopathy as well as poly myoclonus/asterixis-muscle relaxants, pain medications and gabapentin. I suspect that his current presentation is consistent with toxic metabolic encephalopathy and not with stroke due to no evidence of lateralizing signs.  Impression: Toxic metabolic encephalopathy Asterixis/poly myoclonus in the  setting of polypharmacy Evaluate for any evidence of underlying metabolic derangements Low suspicion for stroke  Recommendations: CBC BMP Chest x-ray, UA Ammonia level Toxicology screen May need admission for medical optimization as well as management of any tox metabolic derangements that might be seen on the labs. Preliminary plan discussed with Dr. Kathrynn Humble in person. I will follow the labs.  ADDENDUM Cr elevated five times the normal, elevated BUN, hyperkalemia, hyponatremia Above findings likely the cause of his presentation-- mostly due to myoclonus and AMS caused by Neurontin in the setting of AKI.  Hold neurontin and sedating meds as much as possible.  Needs medical management of  the metabolic derangements.  No further inpatient work up at this time from a neurological standpoint.  Plan relayed to Dr. Kathrynn Humble. Please call neurology with questions as needed.   -- Amie Portland, MD Neurologist [facility] Pager: [phone]   CRITICAL CARE ATTESTATION Performed by: Amie Portland, MD Total critical care time: 40 minutes Critical care time was exclusive of separately billable procedures and treating other patients and/or supervising  APPs/Residents/Students Critical care was necessary to treat or prevent imminent or life-threatening deterioration due to strokelike symptoms, toxic metabolic encephalopathy This patient is critically ill and at significant risk for neurological worsening and/or death and care requires constant monitoring. Critical care was time spent personally by me on the following activities: development of treatment plan with patient and/or surrogate as well as nursing, discussions with consultants, evaluation of patient's response to treatment, examination of patient, obtaining history from patient or surrogate, ordering and performing treatments and interventions, ordering and review of laboratory studies, ordering and review of radiographic studies, pulse oximetry, re-evaluation of patient's condition, participation in multidisciplinary rounds and medical decision making of high complexity in the care of this patient.

## 2021-12-19 NOTE — Assessment & Plan Note (Signed)
BP is controlled.  Plan Continue amolodipine  Hold Zestril in light of acute renal failure - possible causative agent  Add diuretic

## 2021-12-19 NOTE — Subjective & Objective (Signed)
Joel Hart with a long history of severe pain syndrome from trigeminal and occiptal neuralgia, followed by Dr. Maryruth Eve for pain management. He has recently been treated for a wound infection/abscess posterior neck with a round of Augmentin then a round of doxycycline. He has been having altered mental status and myclonic jerking for the last 24-48 hrs. Earlier on the day of admission he fell out of bed. Per his wife he was unarousable. For this reason EMS activated and patient brought to [facility] for evaluation as code stroke.

## 2021-12-19 NOTE — Assessment & Plan Note (Signed)
Patient with infection/abscess posterior neck at surgical site. He has had a course of augmentin and a course of doxycycline. Per wife the wound looks better, however, the area is erythematous, warm to touch, without drainage. Patient does have leukocytosis with left shift.  Plan Pharmacy consult for Vancomycin

## 2021-12-19 NOTE — ED Notes (Signed)
Pt trialed on [place] at this time - placed on 6L

## 2021-12-19 NOTE — Assessment & Plan Note (Signed)
Patient with hyperkalemia to 6.2 w/o EKG changes. Treatment initiated in ED with calcium, insulin, hydration and Lokelma.  Plan Gentle hydration  Repeat K level at 0100hrs  Bmet in AM

## 2021-12-19 NOTE — ED Notes (Addendum)
Per MD Norins do not need to continue [facility] q2s

## 2021-12-19 NOTE — Assessment & Plan Note (Signed)
Patient with trigeminal and occipatl neuralgia followed by Dr. Maryruth Eve. Has had implantable stimulators including left forehead and posterior neck.  Plan Continue current regimen of MSIR 15 mg q6 prn  Reduce dose of gabapentin from 800 mg qid to 300 mg TID due to renal failure

## 2021-12-19 NOTE — ED Notes (Signed)
Admitting MD at bedside - per admitting MD would D/C the fluid bolus and would like pt to get NS at 150 hr

## 2021-12-19 NOTE — ED Notes (Signed)
EDP at bedside  

## 2021-12-19 NOTE — ED Notes (Signed)
Pt desatted on 6L [place] - placed back on NRB - MD notified - Wife at bedside

## 2021-12-19 NOTE — ED Provider Notes (Signed)
[facility] EMERGENCY DEPARTMENT Provider Note   CSN: [id number] Arrival date & time: 12/19/21  2004  An emergency department physician performed an initial assessment on this suspected stroke patient at 2008.  History  Chief Complaint  Patient presents with   Code Stroke    Joel Hart is a 66 y.o. male.  HPI    66 year old male comes in with chief complaint of altered mental status and code stroke.  Patient has history of chronic pain issues including trigeminal neuralgia.  He has pain stimulator in place and is on gabapentin and other pain medications.  He lives at home.  EMS was called by family after patient was heard falling down in his room.  When they went to check on him he was unresponsive.  Per EMS, patient had an abnormal gaze, and they activated code stroke.  I spoke with patient's wife afterwards.  She states that patient has gone through a lot of issues including infection of his pain stimulator and he was doing fine yesterday, but this evening when they found him he was quite a bit different than usual.  He has been eating and drinking well.  No recent fevers, chills.     Home Medications Prior to Admission medications   Medication Sig Start Date End Date Taking? Authorizing Provider  clonazePAM (KLONOPIN) 2 MG tablet TAKE 1 TABLET (2 MG) BY MOUTH 2 TIMES DAILY PRN FOR ANXIETY. DO NOT TAKE IN COMBINATION WITH OPIOID PAIN MEDICATION(S). 10/30/17   [provider]  clonazePAM (KLONOPIN) 2 MG tablet Take 1 tablet (2 mg total) by mouth 2 (two) times daily for 3 days. 08/16/21 08/19/21  Eustaquio Maize, PA-C  cloNIDine (CATAPRES) 0.2 MG tablet Take 1 tablet by mouth at bedtime. 01/28/17   [provider]  Cyanocobalamin (VITAMIN B-12 PO) Take 1 tablet by mouth daily.    [provider]  folic acid (FOLVITE) 1 MG tablet Take 1 mg by mouth daily. 12/17/18   [provider]  gabapentin (NEURONTIN) 800 MG tablet Take 1 tablet  by mouth 4 (four) times daily. 02/03/19   [provider]  gabapentin (NEURONTIN) 800 MG tablet Take by mouth. 08/17/19   [provider]  morphine (MS CONTIN) 30 MG 12 hr tablet Take 1 tablet by mouth 3 (three) times daily. 02/06/19   [provider]  PARoxetine (PAXIL) 30 MG tablet Take 30 mg by mouth daily. 02/08/19   [provider]  tiZANidine (ZANAFLEX) 4 MG tablet Take 4 mg by mouth every 12 (twelve) hours as needed. 02/13/19   [provider]  verapamil (COVERA HS) 240 MG (CO) 24 hr tablet Take 240 mg by mouth at bedtime.    [provider]      Allergies    Suboxone  [buprenorphine hcl-naloxone hcl] and Mirtazapine    Review of Systems   Review of Systems  All other systems reviewed and are negative.   Physical Exam Updated Vital Signs BP 105/61   Pulse 74   Temp 98.1 F (36.7 C) (Temporal)   Resp 14   Wt 98.8 kg   SpO2 100%   BMI 34.11 kg/m  Physical Exam Vitals and nursing note reviewed.  Constitutional:      Appearance: He is well-developed.  HENT:     Head: Atraumatic.  Eyes:     Comments: 4 mm and equal  Cardiovascular:     Rate and Rhythm: Normal rate.  Pulmonary:     Effort: Pulmonary effort  is normal.  Abdominal:     Tenderness: There is no abdominal tenderness.  Musculoskeletal:     Cervical back: Neck supple.  Skin:    General: Skin is warm.  Neurological:     Mental Status: He is alert. He is disoriented.     Comments: Patient has myoclonic type jerking over both his extremities. No nystagmus, upper extremity strength is 4+ out of 5, lower extremity strength is 4+ out of 5 bilaterally. Patient oriented to self, location but also noted to be tangential in thinking.     ED Results / Procedures / Treatments   Labs (all labs ordered are listed, but only abnormal results are displayed) Labs Reviewed  APTT - Abnormal; Notable for the following components:      Result Value   aPTT 23 (*)    All  other components within normal limits  CBC - Abnormal; Notable for the following components:   WBC 14.2 (*)    RBC 3.33 (*)    Hemoglobin 10.8 (*)    HCT 32.8 (*)    All other components within normal limits  DIFFERENTIAL - Abnormal; Notable for the following components:   Neutro Abs 12.9 (*)    Lymphs Abs 0.6 (*)    All other components within normal limits  COMPREHENSIVE METABOLIC PANEL - Abnormal; Notable for the following components:   Sodium 133 (*)    Potassium 6.4 (*)    CO2 20 (*)    Glucose, Bld 164 (*)    BUN 59 (*)    Creatinine, Ser 4.73 (*)    Calcium 8.0 (*)    Total Protein 6.4 (*)    GFR, Estimated 13 (*)    All other components within normal limits  I-STAT CHEM 8, ED - Abnormal; Notable for the following components:   Sodium 131 (*)    Potassium 6.2 (*)    BUN 70 (*)    Creatinine, Ser 5.10 (*)    Glucose, Bld 156 (*)    Calcium, Ion 0.94 (*)    TCO2 21 (*)    Hemoglobin 10.9 (*)    HCT 32.0 (*)    All other components within normal limits  CBG MONITORING, ED - Abnormal; Notable for the following components:   Glucose-Capillary 149 (*)    All other components within normal limits  CBG MONITORING, ED - Abnormal; Notable for the following components:   Glucose-Capillary 135 (*)    All other components within normal limits  PROTIME-INR  ETHANOL  AMMONIA  CK  SODIUM, URINE, RANDOM  CREATININE, URINE, RANDOM  BRAIN NATRIURETIC PEPTIDE  I-STAT VENOUS BLOOD GAS, ED    EKG EKG Interpretation  Date/Time:  Tuesday December 19 2021 20:35:10 EDT Ventricular Rate:  78 PR Interval:    QRS Duration: 99 QT Interval:  366 QTC Calculation: 417 R Axis:   25 Text Interpretation: Sinus rhythm Minimal ST depression, diffuse leads No acute changes No significant change since last tracing Confirmed by Varney Biles ([id number]) on 12/19/2021 8:58:28 PM  Radiology DG Chest Port 1 View  Result Date: 12/19/2021 CLINICAL DATA:  Short of breath, stroke EXAM: PORTABLE CHEST  1 VIEW COMPARISON:  08/19/2019 FINDINGS: Single frontal view of the chest demonstrates stable catheter tubing overlying left chest. Cardiac silhouette is unremarkable. There is increased central vascular congestion, with developing bilateral perihilar airspace disease right greater than left. No effusion or pneumothorax. No acute bony abnormalities. IMPRESSION: 1. Findings consistent with mild congestive heart failure. Electronically Signed  By: Randa Ngo M.D.   On: 12/19/2021 21:49   US RENAL  Result Date: 12/19/2021 CLINICAL DATA:  [id number].  Renal failure. EXAM: RENAL / URINARY TRACT ULTRASOUND COMPLETE COMPARISON:  None Available. FINDINGS: Right Kidney: Renal measurements: 10.7 x 4.7 x 5.1 cm = volume: 134 mL. Echogenicity within normal limits. No mass or hydronephrosis visualized. Left Kidney: Renal measurements: 10.2 x 6.4 x 5 cm = volume: 171 mL. Echogenicity within normal limits. No mass or hydronephrosis visualized. Urinary bladder: Appears normal for degree of bladder distention. Other: None. IMPRESSION: Unremarkable renal ultrasound. Electronically Signed   By: Iven Finn M.D.   On: 12/19/2021 21:27   CT Cervical Spine Wo Contrast  Result Date: 12/19/2021 CLINICAL DATA:  Found down EXAM: CT CERVICAL SPINE WITHOUT CONTRAST TECHNIQUE: Multidetector CT imaging of the cervical spine was performed without intravenous contrast. Multiplanar CT image reconstructions were also generated. RADIATION DOSE REDUCTION: This exam was performed according to the departmental dose-optimization program which includes automated exposure control, adjustment of the mA and/or kV according to patient size and/or use of iterative reconstruction technique. COMPARISON:  01/21/2015 FINDINGS: Alignment: Alignment is grossly anatomic. Skull base and vertebrae: No acute fracture. No primary bone lesion or focal pathologic process. Soft tissues and spinal canal: No prevertebral fluid or swelling. No visible canal  hematoma. Disc levels: Stable C5-6 ACDF. Bony fusion at C6-7 unchanged. Severe spondylosis at C3-4 results in significant bony encroachment upon the central canal, stable. Moderate C4-5 and C7-T1 spondylosis unchanged. Upper chest: Airway is patent. There is biapical ground-glass airspace disease. Other: Reconstructed images demonstrate no additional findings. IMPRESSION: 1. No acute cervical spine fracture. 2. Multilevel cervical spondylosis greatest at C3-4, stable. 3. Biapical ground-glass airspace disease which may reflect edema, aspiration, or infection. Electronically Signed   By: Randa Ngo M.D.   On: 12/19/2021 20:43   CT HEAD CODE STROKE WO CONTRAST  Result Date: 12/19/2021 CLINICAL DATA:  Code stroke. Initial evaluation for neuro deficit, stroke. EXAM: CT HEAD WITHOUT CONTRAST TECHNIQUE: Contiguous axial images were obtained from the base of the skull through the vertex without intravenous contrast. RADIATION DOSE REDUCTION: This exam was performed according to the departmental dose-optimization program which includes automated exposure control, adjustment of the mA and/or kV according to patient size and/or use of iterative reconstruction technique. COMPARISON:  Prior CT from 01/21/2015. FINDINGS: Brain: Age-related cerebral atrophy with mild chronic microvascular ischemic disease. No acute intracranial hemorrhage. No acute large vessel territory infarct. No mass lesion, midline shift or mass effect. No hydrocephalus or extra-axial fluid collection. Vascular: No abnormal hyperdense vessel. Scattered vascular calcifications noted within the carotid siphons. Skull: Scalp soft tissues and calvarium demonstrate no acute finding. Generator with electrode present within the left scalp and neck. Sinuses/Orbits: Globes and orbital soft tissues demonstrate no acute finding. Paranasal sinuses are largely clear. No mastoid effusion. Other: None. ASPECTS [facility] Stroke Program Early CT Score) - Ganglionic  level infarction (caudate, lentiform nuclei, internal capsule, insula, M1-M3 cortex): 7 - Supraganglionic infarction (M4-M6 cortex): 3 Total score (0-10 with 10 being normal): 10 IMPRESSION: 1. No acute intracranial abnormality. 2. ASPECTS is 10. These results were communicated to Dr. Rory Percy at 8:41 pm on 12/19/2021 by text page via the [facility] messaging system. Electronically Signed   By: Jeannine Boga M.D.   On: 12/19/2021 20:43    Procedures .Critical Care  Performed by: Varney Biles, MD Authorized by: Varney Biles, MD   Critical care provider statement:    Critical care time (minutes):  68   Critical care was necessary to treat or prevent imminent or life-threatening deterioration of the following conditions:  CNS failure or compromise and renal failure   Critical care was time spent personally by me on the following activities:  Development of treatment plan with patient or surrogate, discussions with consultants, evaluation of patient's response to treatment, examination of patient, ordering and review of laboratory studies, ordering and review of radiographic studies, ordering and performing treatments and interventions, pulse oximetry, re-evaluation of patient's condition and review of old charts     Medications Ordered in ED Medications  sodium chloride flush (NS) 0.9 % injection 3 mL (has no administration in time range)  sodium zirconium cyclosilicate (LOKELMA) packet 10 g (10 g Oral Not Given 12/19/21 2113)  calcium gluconate 1 g/ 50 mL sodium chloride IVPB (has no administration in time range)  lactated ringers infusion (has no administration in time range)  insulin aspart (novoLOG) injection 5 Units (5 Units Intravenous Given 12/19/21 2155)    And  dextrose 50 % solution 50 mL (50 mLs Intravenous Given 12/19/21 2155)  sodium bicarbonate injection 50 mEq (50 mEq Intravenous Given 12/19/21 2155)  sodium chloride 0.9 % bolus 2,000 mL (2,000 mLs Intravenous New Bag/Given 12/19/21  2205)    ED Course/ Medical Decision Making/ A&P                           Medical Decision Making Amount and/or Complexity of Data Reviewed Labs: ordered. Radiology: ordered.  Risk OTC drugs. Prescription drug management. Decision regarding hospitalization.   This patient presents to the ED with chief complaint(s) of altered mental status, abnormal gaze with pertinent past medical history of chronic pain issues including trigeminal neuralgia and active infection of the nerve stimulator which further complicates the presenting complaint. The complaint involves an extensive differential diagnosis and also carries with it a high risk of complications and morbidity.    The differential diagnosis includes : Acute brain bleed secondary to fall, ischemic stroke, severe electrolyte abnormality, severe sepsis, medication side effect/toxic encephalopathy, renal liver failure/metabolic encephalopathy.  The initial plan is to get basic labs, CT scan of the brain and C-spine   Additional history obtained: Additional history obtained from spouse Records reviewed Care Everywhere/External Records  Independent labs interpretation:  The following labs were independently interpreted: Patient's creatinine has risen to almost 6.  BUN is also over 60.  According to Care Everywhere, patient's creatinine was 0.9 and earlier this year. Patient also has hyperkalemia.  EKG does not show prolonged QT, QRS widening or peaked T waves.   Independent visualization of imaging: - I independently visualized the following imaging with scope of interpretation limited to determining acute life threatening conditions related to emergency care: CT scan of the brain, which revealed no evidence of brain bleed  Treatment and Reassessment: Patient reassessed after the labs.  We have ordered hyperkalemia medications. BUN/creatinine appears to be less than 20.  Likely not prerenal.  Ultrasound kidney ordered  We will  order 2 L of normal saline along with LR maintenance. Hyperkalemia medications ordered.  Consultation: - Consulted or discussed management/test interpretation w/ external professional: Neurology signing off after the initial work-up. Nephrology has been consulted.  Dr. Moshe Cipro will see the patient.  Medicine will admit.    Final Clinical Impression(s) / ED Diagnoses Final diagnoses:  AKI (acute kidney injury) ([place])  Uremia    Rx / DC Orders ED Discharge Orders  None         Varney Biles, MD 12/19/21 2205

## 2021-12-19 NOTE — Assessment & Plan Note (Signed)
Long standing problem of depression and anxiety related to chronic pain syndrome  Plan  Continue Klonopin and SSRI

## 2021-12-19 NOTE — Assessment & Plan Note (Signed)
Per old [facility] records patient carries dx of renal insufficiency since 2015 with a creatinine at that time of 1.3. Per patient and his wife lab work was done 2-3 weeks ago and they were not told of any abnormality. Per wife he has bee hydrating normally. On presentation creatinine now 5.1 with hyperkalemia to 6.2.  Plan Gentle hydration for possible intravascular depletion.  R/u Bmet in AM  Nephrology consult in AM

## 2021-12-19 NOTE — ED Notes (Signed)
MD Kathrynn Humble okay with pt remaining NPO at this time and holding [facility]

## 2021-12-19 NOTE — ED Triage Notes (Signed)
Pt BIB EMS from home. Code Stroke. LVO 4.LKW 2230 yesterday. EMS reports they were originally called out as cardiac arrest, no arrest. When EMS arrived pt was crawling around on floor, disoriented, and thought he was in [place]. EMS reported gaze to the left. Pt vomited and aspirated in route. Pt was 76% on room air, improved to 96% on NRB. Pt has extensive med history.  VS with EMS  136/86 NSR at 82 RR 18-23 )2 96% Bon NRB at 10

## 2021-12-20 ENCOUNTER — Inpatient Hospital Stay (HOSPITAL_COMMUNITY): Payer: Medicare Other

## 2021-12-20 ENCOUNTER — Encounter (HOSPITAL_COMMUNITY): Payer: Self-pay | Admitting: Internal Medicine

## 2021-12-20 ENCOUNTER — Other Ambulatory Visit (HOSPITAL_COMMUNITY): Payer: Medicare Other

## 2021-12-20 DIAGNOSIS — J810 Acute pulmonary edema: Secondary | ICD-10-CM | POA: Diagnosis not present

## 2021-12-20 DIAGNOSIS — I249 Acute ischemic heart disease, unspecified: Secondary | ICD-10-CM | POA: Diagnosis not present

## 2021-12-20 DIAGNOSIS — L0211 Cutaneous abscess of neck: Secondary | ICD-10-CM | POA: Diagnosis not present

## 2021-12-20 DIAGNOSIS — I214 Non-ST elevation (NSTEMI) myocardial infarction: Secondary | ICD-10-CM

## 2021-12-20 DIAGNOSIS — N179 Acute kidney failure, unspecified: Secondary | ICD-10-CM | POA: Diagnosis not present

## 2021-12-20 DIAGNOSIS — L03221 Cellulitis of neck: Secondary | ICD-10-CM | POA: Diagnosis not present

## 2021-12-20 LAB — BASIC METABOLIC PANEL
Anion gap: 13 (ref 5–15)
BUN: 57 mg/dL — ABNORMAL HIGH (ref 8–23)
CO2: 24 mmol/L (ref 22–32)
Calcium: 8.2 mg/dL — ABNORMAL LOW (ref 8.9–10.3)
Chloride: 100 mmol/L (ref 98–111)
Creatinine, Ser: 4.58 mg/dL — ABNORMAL HIGH (ref 0.61–1.24)
GFR, Estimated: 13 mL/min — ABNORMAL LOW (ref >60)
Glucose, Bld: 128 mg/dL — ABNORMAL HIGH (ref 70–99)
Potassium: 4.6 mmol/L (ref 3.5–5.1)
Sodium: 137 mmol/L (ref 135–145)

## 2021-12-20 LAB — HIV ANTIBODY (ROUTINE TESTING W REFLEX): HIV Screen 4th Generation wRfx: NONREACTIVE

## 2021-12-20 LAB — CBC
HCT: 31.7 % — ABNORMAL LOW (ref 39.0–52.0)
Hemoglobin: 10.5 g/dL — ABNORMAL LOW (ref 13.0–17.0)
MCH: 32.1 pg (ref 26.0–34.0)
MCHC: 33.1 g/dL (ref 30.0–36.0)
MCV: 96.9 fL (ref 80.0–100.0)
Platelets: 336 10*3/uL (ref 150–400)
RBC: 3.27 MIL/uL — ABNORMAL LOW (ref 4.22–5.81)
RDW: 12.9 % (ref 11.5–15.5)
WBC: 15.7 10*3/uL — ABNORMAL HIGH (ref 4.0–10.5)
nRBC: 0 % (ref 0.0–0.2)

## 2021-12-20 LAB — SODIUM, URINE, RANDOM: Sodium, Ur: <10 mmol/L

## 2021-12-20 LAB — URINALYSIS, ROUTINE W REFLEX MICROSCOPIC
Bacteria, UA: NONE SEEN
Bilirubin Urine: NEGATIVE
Glucose, UA: NEGATIVE mg/dL
Ketones, ur: NEGATIVE mg/dL
Leukocytes,Ua: NEGATIVE
Nitrite: NEGATIVE
Protein, ur: NEGATIVE mg/dL
Specific Gravity, Urine: 1.008 (ref 1.005–1.030)
pH: 5 (ref 5.0–8.0)

## 2021-12-20 LAB — I-STAT ARTERIAL BLOOD GAS, ED
Acid-base deficit: 4 mmol/L — ABNORMAL HIGH (ref 0.0–2.0)
Bicarbonate: 22.3 mmol/L (ref 20.0–28.0)
Calcium, Ion: 1.1 mmol/L — ABNORMAL LOW (ref 1.15–1.40)
HCT: 30 % — ABNORMAL LOW (ref 39.0–52.0)
Hemoglobin: 10.2 g/dL — ABNORMAL LOW (ref 13.0–17.0)
O2 Saturation: 87 %
Patient temperature: 98.6
Potassium: 4.7 mmol/L (ref 3.5–5.1)
Sodium: 133 mmol/L — ABNORMAL LOW (ref 135–145)
TCO2: 24 mmol/L (ref 22–32)
pCO2 arterial: 45.9 mmHg (ref 32–48)
pH, Arterial: 7.294 — ABNORMAL LOW (ref 7.35–7.45)
pO2, Arterial: 59 mmHg — ABNORMAL LOW (ref 83–108)

## 2021-12-20 LAB — BRAIN NATRIURETIC PEPTIDE: B Natriuretic Peptide: 1543.9 pg/mL — ABNORMAL HIGH (ref 0.0–100.0)

## 2021-12-20 LAB — POTASSIUM: Potassium: 4.9 mmol/L (ref 3.5–5.1)

## 2021-12-20 LAB — TROPONIN I (HIGH SENSITIVITY)
Troponin I (High Sensitivity): 974 ng/L (ref <18)
Troponin I (High Sensitivity): 1869 ng/L (ref <18)
Troponin I (High Sensitivity): 4101 ng/L (ref <18)

## 2021-12-20 LAB — HEPARIN LEVEL (UNFRACTIONATED)
Heparin Unfractionated: <0.1 IU/mL — ABNORMAL LOW (ref 0.30–0.70)
Heparin Unfractionated: 0.25 IU/mL — ABNORMAL LOW (ref 0.30–0.70)

## 2021-12-20 LAB — CREATININE, URINE, RANDOM: Creatinine, Urine: 176 mg/dL

## 2021-12-20 MED ORDER — HEPARIN (PORCINE) 25000 UT/250ML-% IV SOLN
1950.0000 [IU]/h | INTRAVENOUS | Status: DC
  Administered 2021-12-20: 1200 [IU]/h via INTRAVENOUS
  Administered 2021-12-20: 1700 [IU]/h via INTRAVENOUS
  Administered 2021-12-21 – 2021-12-22 (×2): 1800 [IU]/h via INTRAVENOUS
  Filled 2021-12-20 (×4): qty 250

## 2021-12-20 MED ORDER — VANCOMYCIN VARIABLE DOSE PER UNSTABLE RENAL FUNCTION (PHARMACIST DOSING)
Status: DC
Start: 2021-12-20 — End: 2021-12-21

## 2021-12-20 MED ORDER — ACETAMINOPHEN 650 MG RE SUPP
650.0000 mg | Freq: Four times a day (QID) | RECTAL | Status: DC | PRN
Start: 2021-12-20 — End: 2021-12-24
  Administered 2021-12-20: 650 mg via RECTAL
  Filled 2021-12-20: qty 1

## 2021-12-20 MED ORDER — HEPARIN BOLUS VIA INFUSION
2500.0000 [IU] | Freq: Once | INTRAVENOUS | Status: AC
  Administered 2021-12-20: 2500 [IU] via INTRAVENOUS
  Filled 2021-12-20: qty 2500

## 2021-12-20 MED ORDER — ONDANSETRON HCL 4 MG/2ML IJ SOLN
4.0000 mg | Freq: Four times a day (QID) | INTRAMUSCULAR | Status: DC | PRN
  Administered 2021-12-20: 4 mg via INTRAVENOUS
  Filled 2021-12-20: qty 2

## 2021-12-20 MED ORDER — LORAZEPAM 2 MG/ML IJ SOLN
0.5000 mg | INTRAMUSCULAR | Status: DC | PRN
Start: 2021-12-20 — End: 2021-12-24
  Administered 2021-12-20 – 2021-12-23 (×5): 0.5 mg via INTRAVENOUS
  Filled 2021-12-20 (×7): qty 1

## 2021-12-20 MED ORDER — HALOPERIDOL LACTATE 5 MG/ML IJ SOLN
2.0000 mg | Freq: Four times a day (QID) | INTRAMUSCULAR | Status: DC | PRN
  Administered 2021-12-20 – 2021-12-23 (×2): 2 mg via INTRAVENOUS
  Filled 2021-12-20 (×2): qty 1

## 2021-12-20 MED ORDER — HYDROMORPHONE HCL 1 MG/ML IJ SOLN: 0.5000 mg | INTRAMUSCULAR | Status: DC | PRN

## 2021-12-20 NOTE — Progress Notes (Signed)
Progress Note   Patient: Joel Hart HAL:[id number] DOB: [birth year] DOA: 12/19/2021     1 DOS: the patient was seen and examined on 12/20/2021   Brief hospital course: 66yo with a long history of severe pain syndrome from trigeminal and occiptal neuralgia, followed by Dr. Maryruth Eve for pain management. He has recently been treated for a wound infection/abscess posterior neck with a round of Augmentin then a round of doxycycline. He has been having altered mental status and myclonic jerking for the last 24-48 hrs. Earlier on the day of admission he fell out of bed. Per his wife he was unarousable. For this reason EMS activated and patient brought to [facility] for evaluation as code stroke.     ED Course: Afebrile, 105/61 HR 74  RR 14. Patient appeared confused. Code Stroke called: CT head negative, neuro consult by Dr. Rory Percy - ruled out stroke. Lab revealed hyperkalemia at 6.2, acute renal failure with BUN 70 Cr 5.1. WBC elevated at 14.2 with 91/4/5. CT c-Spine negative but biapical ASD c/w edema, vs infection noted. CXR with central vascualr and perihilar ASD c/w CHF. Renal U/S negative. TRH called to admit for continue evaluation of new onset CHF, acute renal failure and to direct treatment.   Assessment and Plan: * Acute renal failure [facility]) Per old [facility] records patient carries dx of renal insufficiency since 2015 with a creatinine at that time of 1.3.  -On presentation creatinine now 5.1 with hyperkalemia to 6.2. -Pt was initially given gentle hydration for possible intravascular depletion. -Given concerns of vol overload, was later given dose of IV lasix -Indwelling cath placed with very good urine output, 1300cc documented thus far -Appreciate input by Nephrology. Recs to start acute GN work up. Rec to hold further lasix for now given low FeNa and AKI, however may need to resume tomorrow pending labs and repeat CXR -recheck BMET in AM   Pulmonary edema Patient denies any cardiac history. Denies  chest pain and denies acute SOB. Wife reports he has had a cough and is sedentary. CXR and CT reveal Air space disease strongly suggestive of pulmonary edema. BNP elevated to 1,080. EKG w/o acute changes.  -Patient presented with hypoxemia requiring 100% rebreather facemask and later BiPap -Given course of IV lasix with good output, now weaned to HFNC - 2d echo ordered by Cardiology, however pt refused exam -Wean O2 as tolerated  NSTEMI -Trop peaked to the 4000 range -Continued on heparin gtt -Per Cards, unclear at this time if he will need invasive w/u. Cont to diurese per Nephro   Acute hyperkalemia Patient with hyperkalemia to 6.2 w/o EKG changes.  -resolved   Cellulitis and abscess of neck Patient with infection/abscess posterior neck at surgical site. He has had a course of augmentin and a course of doxycycline. Per wife the wound looks better, however, the area is erythematous, warm to touch, without drainage. Patient does have leukocytosis with left shift.  -On Vancomycin per pharmacy dosing   Chronic pain syndrome Patient with trigeminal and occipatl neuralgia followed by Dr. Maryruth Eve. Has had implantable stimulators including left forehead and posterior neck. -In setting of renal failure, avoid morphine -Recs for hydromorphone, fentanyl, and methadone for pain control -Gabapentin renally dosed -Will d/c morphine, change to PRN dilaudid    Depression with anxiety Long standing problem of depression and anxiety related to chronic pain syndrome - Continue Klonopin and SSRI   HTN (hypertension) -BP is controlled. -Continue amolodipine -Cont to hold Zestril in light of acute renal failure  Subjective: Eager to go home  Physical Exam: Vitals:   12/20/21 1330 12/20/21 1501 12/20/21 1510 12/20/21 1611  BP: (!) 108/57   120/61  Pulse: 88   81  Resp: 19   13  Temp:  99.5 F (37.5 C)  99 F (37.2 C)  TempSrc:  Oral  Oral  SpO2: 98%  96% 94%  Weight:      Height:        General exam: Awake, laying in bed, in nad Respiratory system: Normal respiratory effort, no wheezing Cardiovascular system: regular rate, s1, s2 Gastrointestinal system: Soft, nondistended, positive BS Central nervous system: CN2-12 grossly intact, strength intact Extremities: Perfused, no clubbing Skin: Normal skin turgor, no notable skin lesions seen Psychiatry: Mood normal // no visual hallucinations   Data Reviewed:  Labs reviewed: Na 137, K 4.6, Cr 4.58, Trop 4,101   Family Communication: Pt in room, discussed with pt's wife over phone  Disposition: Status is: Inpatient Remains inpatient appropriate because: Severity of illness  Planned Discharge Destination: Home    Author: Marylu Lund, MD 12/20/2021 7:07 PM  For on call review [email or web address].

## 2021-12-20 NOTE — Progress Notes (Signed)
[place] for Heparin Indication: chest pain/ACS  Allergies  Allergen Reactions   Suboxone  [Buprenorphine Hcl-Naloxone Hcl] Other (See Comments) and Shortness Of Breath   Mirtazapine Other (See Comments)    Other reaction(s): Other    Patient Measurements: Height: '5\' 7"'$  (170.2 cm) Weight: 98.8 kg (217 lb 13 oz) IBW/kg (Calculated) : 66.1 Heparin Dosing Weight: 87.5 kg  Vital Signs: Temp: 98.5 F (36.9 C) (08/09 1909) Temp Source: Oral (08/09 1909) BP: 133/69 (08/09 1909) Pulse Rate: 89 (08/09 1909)  Labs: Recent Labs    12/19/21 2034 12/19/21 2040 12/19/21 2112 12/19/21 2115 12/19/21 2209 12/20/21 0227 12/20/21 0536 12/20/21 0651 12/20/21 1004 12/20/21 1953  HGB 10.8* 10.9*  --   --  11.6* 10.5*  --  10.2*  --   --   HCT 32.8* 32.0*  --   --  34.0* 31.7*  --  30.0*  --   --   PLT 366  --   --   --   --  336  --   --   --   --   APTT 23*  --   --   --   --   --   --   --   --   --   LABPROT 14.9  --   --   --   --   --   --   --   --   --   INR 1.2  --   --   --   --   --   --   --   --   --   HEPARINUNFRC  --   --   --   --   --   --   --   --  <0.10* 0.25*  CREATININE 4.73* 5.10*  --   --   --  4.58*  --   --   --   --   CKTOTAL  --   --   --  275  --   --   --   --   --   --   TROPONINIHS  --   --  974*  --   --  1,869* 4,101*  --   --   --      Estimated Creatinine Clearance: 17.3 mL/min (A) (by C-G formula based on SCr of 4.58 mg/dL (H)).  Assessment: 66 y.o. male with continues on IV heparin for ACS.   Heparin level subtherapeutic (0.25) on infusion at 1550 units/hr. No issues with line or bleeding reported per RN.  Goal of Therapy:  Heparin level 0.3-0.7 units/ml Monitor platelets by anticoagulation protocol: Yes   Plan: Increase heparin infusion to 1550 units/hr Check an 8 hr heparin level  Sherlon Handing, PharmD, BCPS Please see amion for complete clinical pharmacist phone list 12/20/2021,8:47 PM

## 2021-12-20 NOTE — ED Notes (Signed)
Dr. Earlie Counts notified of fever 100.8 rectally. Awaiting orders.

## 2021-12-20 NOTE — ED Notes (Signed)
Pt attempting to get out of bed, pt able to be redirected and assisted back up into the bed.

## 2021-12-20 NOTE — ED Notes (Signed)
Pt continuously taking NRB mask off. Attempted to educate pt on importance of keeping mask on due to O2 sats dropping to 77%. Pt becoming rude to staff when asked to stop taking mask off. NRB mask placed back on pt at this time.

## 2021-12-20 NOTE — Progress Notes (Signed)
[place] for Heparin Indication: chest pain/ACS  Allergies  Allergen Reactions   Suboxone  [Buprenorphine Hcl-Naloxone Hcl] Other (See Comments) and Shortness Of Breath   Mirtazapine Other (See Comments)    Other reaction(s): Other    Patient Measurements: Height: '5\' 7"'$  (170.2 cm) Weight: 98.8 kg (217 lb 13 oz) IBW/kg (Calculated) : 66.1 Heparin Dosing Weight: 87.5 kg  Vital Signs: Temp: 100.8 F (38.2 C) (08/09 1126) Temp Source: Rectal (08/09 1126) BP: 170/46 (08/09 0930) Pulse Rate: 88 (08/09 0930)  Labs: Recent Labs    12/19/21 2034 12/19/21 2040 12/19/21 2112 12/19/21 2115 12/19/21 2209 12/20/21 0227 12/20/21 0536 12/20/21 0651 12/20/21 1004  HGB 10.8* 10.9*  --   --  11.6* 10.5*  --  10.2*  --   HCT 32.8* 32.0*  --   --  34.0* 31.7*  --  30.0*  --   PLT 366  --   --   --   --  336  --   --   --   APTT 23*  --   --   --   --   --   --   --   --   LABPROT 14.9  --   --   --   --   --   --   --   --   INR 1.2  --   --   --   --   --   --   --   --   HEPARINUNFRC  --   --   --   --   --   --   --   --  <0.10*  CREATININE 4.73* 5.10*  --   --   --  4.58*  --   --   --   CKTOTAL  --   --   --  275  --   --   --   --   --   TROPONINIHS  --   --  974*  --   --  1,869* 4,101*  --   --      Estimated Creatinine Clearance: 17.3 mL/min (A) (by C-G formula based on SCr of 4.58 mg/dL (H)).  Assessment: 66 y.o. male with continues on IV heparin for ACS. Initial heparin level is undetectable. CBC is stable and no bleeding noted.   Goal of Therapy:  Heparin level 0.3-0.7 units/ml Monitor platelets by anticoagulation protocol: Yes   Plan:  Heparin bolus 2500 units IV x1 Heparin gtt 1550 units/hr Check an 8 hr heparin level Daily heparin level and CBC   Cliffie Gingras, Rande Lawman 12/20/2021,11:51 AM

## 2021-12-20 NOTE — ED Notes (Signed)
IV team at bedside 

## 2021-12-20 NOTE — Consult Note (Addendum)
Reason for Consult: AKI Referring Physician: Wyline Copas, MD  Joel Hart is an 66 y.o. male with a PMH significant for HTN, HLD, OSA on CPAP, h/o testicular cancer, MDD, anxiety, PTSD, recent neck abscess/infection (treated with augmentin, then doxycycline), and chronic pain syndrome who presented to [facility] ED on 12/19/21 via EMS after he fell out of bed and was unarousable.  In the ED, he was afebrile and VSS.  He remained confused and Code stroke called.  CT of head was negative and Neuro evaluated pt and ruled out CVA.  His labs were notable for Na 133, BUN 70, Cr 5.1, K 6.2, WBC 14.2, Hgb 10.8, BNP 1080, troponin 4101.  He was admitted for further workup of his acute CHF/pulmonary edema, NSTEMI, and we were consulted to further evaluate and manage his AKI.    He remains confused and agitated and has required restraints due to pulling at lines and BiPAP.  He could not participate with HPI and therefore existing EMR was used.  The trend in Scr is seen below.    Trend in Creatinine: Creatinine, Ser  Date/Time Value Ref Range Status  12/20/2021 02:27 AM 4.58 (H) 0.61 - 1.24 mg/dL Final  12/19/2021 08:40 PM 5.10 (H) 0.61 - 1.24 mg/dL Final  12/19/2021 08:34 PM 4.73 (H) 0.61 - 1.24 mg/dL Final  06/19/2021 0.99 0.50 - 1.50 mg/dL Final  03/30/2019 11:56 AM 1.03 0.40 - 1.50 mg/dL Final  10/08/2013 10:23 AM 1.39 (H) 0.50 - 1.35 mg/dL Final  04/30/2013 04:22 PM 1.33 (H) 0.60 - 1.30 mg/dL Final  06/21/2008 02:34 PM 1.5 0.4 - 1.5 mg/dL Final    PMH:   Past Medical History:  Diagnosis Date   Anxiety    Arthritis    Cancer ([place])    testicular   Depression    GERD (gastroesophageal reflux disease)    High triglycerides 12/19/2021   Hyperlipidemia    Hypertension    Sleep apnea    uses CPAP   Trigeminus neuralgia     PSH:   Past Surgical History:  Procedure Laterality Date   BACK SURGERY     BRAIN SURGERY     times 2   CERVICAL FUSION     COLON SURGERY     from parasite    SPINAL CORD  STIMULATOR INSERTION N/A 10/13/2013   Procedure: Supraorbital Peripheral nerve stimulator with Dr. Vertell Limber assisting;  Surgeon: Bonna Gains, MD;  Location: [facility] NEURO ORS;  Service: Neurosurgery;  Laterality: N/A;   SURGERY SCROTAL / TESTICULAR      Allergies:  Allergies  Allergen Reactions   Suboxone  [Buprenorphine Hcl-Naloxone Hcl] Other (See Comments) and Shortness Of Breath   Mirtazapine Other (See Comments)    Other reaction(s): Other    Medications:   Prior to Admission medications   Medication Sig Start Date End Date Taking? Authorizing Provider  amLODipine (NORVASC) 10 MG tablet Take 10 mg by mouth daily. 12/05/21  Yes [provider]  clonazePAM (KLONOPIN) 2 MG tablet Take 2 mg by mouth 2 (two) times daily as needed for anxiety. 10/30/17  Yes [provider]  cloNIDine (CATAPRES) 0.2 MG tablet Take 1 tablet by mouth at bedtime. 01/28/17  Yes [provider]  Cyanocobalamin (VITAMIN B-12 PO) Take 1 tablet by mouth daily.   Yes [provider]  gabapentin (NEURONTIN) 800 MG tablet Take 1 tablet by mouth 4 (four) times daily. 02/03/19  Yes [provider]  lisinopril (ZESTRIL) 20 MG tablet Take 20 mg by mouth  daily. 10/19/21  Yes [provider]  morphine (MS CONTIN) 30 MG 12 hr tablet Take 1 tablet by mouth 3 (three) times daily. 02/06/19  Yes [provider]  PARoxetine (PAXIL) 30 MG tablet Take 30 mg by mouth daily. 02/08/19  Yes [provider]  tiZANidine (ZANAFLEX) 4 MG tablet Take 4 mg by mouth every 12 (twelve) hours as needed. 02/13/19  Yes [provider]    Inpatient medications:  vitamin B-12  1,000 mcg Oral Daily   folic acid  1 mg Oral Daily   furosemide  40 mg Intravenous Q6H   gabapentin  300 mg Oral TID   PARoxetine  30 mg Oral Daily   senna  1 tablet Oral BID   sodium chloride flush  3 mL Intravenous Once   vancomycin variable dose per unstable renal function (pharmacist dosing)   Does not  apply See admin instructions    Discontinued Meds:   Medications Discontinued During This Encounter  Medication Reason   lactated ringers bolus 2,000 mL    morphine (MS CONTIN) 12 hr tablet 30 mg    cloNIDine (CATAPRES) tablet 0.2 mg    verapamil (COVERA HS) 24 hr tablet 240 mg    lactated ringers infusion    heparin injection 5,000 Units    amLODipine (NORVASC) tablet 10 mg    gabapentin (NEURONTIN) 403 MG tablet Duplicate   verapamil (COVERA HS) 240 MG (CO) 24 hr tablet Patient Preference   clonazePAM (KLONOPIN) 2 MG tablet Duplicate   folic acid (FOLVITE) 1 MG tablet Patient Preference    Social History:  reports that he has never smoked. He has never used smokeless tobacco. He reports current alcohol use. He reports current drug use. Drug: Marijuana.  Family History:   Family History  Problem Relation Age of Onset   Hypertension Father     Review of systems not obtained due to patient factors. Weight change:   Intake/Output Summary (Last 24 hours) at 12/20/2021 1430 Last data filed at 12/20/2021 1246 Gross per 24 hour  Intake --  Output 1450 ml  Net -1450 ml   BP (!) 108/57   Pulse 88   Temp (!) 100.8 F (38.2 C) (Rectal)   Resp 19   Ht '5\' 7"'$  (1.702 m)   Wt 98.8 kg   SpO2 98%   BMI 34.11 kg/m  Vitals:   12/20/21 1230 12/20/21 1245 12/20/21 1300 12/20/21 1330  BP: (!) 126/57 129/61 (!) 116/54 (!) 108/57  Pulse: (!) 102 (!) 103 96 88  Resp: '20 18 15 19  '$ Temp:      TempSrc:      SpO2: 94% 99% 92% 98%  Weight:      Height:         General appearance: combative, uncooperative, and wearing Bipap and in restraints Head: Normocephalic, without obvious abnormality, atraumatic Eyes: negative findings: lids and lashes normal, conjunctivae and sclerae normal, and corneas clear Resp: clear to auscultation bilaterally Cardio: regular rate and rhythm, S1, S2 normal, no murmur, click, rub or gallop GI: soft, non-tender; bowel sounds normal; no masses,  no  organomegaly Extremities: extremities normal, atraumatic, no cyanosis or edema  Labs: Basic Metabolic Panel: Recent Labs  Lab 12/19/21 2034 12/19/21 2040 12/19/21 2112 12/19/21 2209 12/20/21 0227 12/20/21 0651  NA 133* 131*  --  131* 137 133*  K 6.4* 6.2* 4.9 6.1* 4.6 4.7  CL 100 101  --   --  100  --   CO2 20*  --   --   --  24  --   GLUCOSE 164* 156*  --   --  128*  --   BUN 59* 70*  --   --  57*  --   CREATININE 4.73* 5.10*  --   --  4.58*  --   ALBUMIN 3.5  --   --   --   --   --   CALCIUM 8.0*  --   --   --  8.2*  --    Liver Function Tests: Recent Labs  Lab 12/19/21 2034  AST 39  ALT 24  ALKPHOS 70  BILITOT 0.5  PROT 6.4*  ALBUMIN 3.5   No results for input(s): "LIPASE", "AMYLASE" in the last 168 hours. Recent Labs  Lab 12/19/21 2039  AMMONIA 25   CBC: Recent Labs  Lab 12/19/21 2034 12/19/21 2040 12/19/21 2209 12/20/21 0227 12/20/21 0651  WBC 14.2*  --   --  15.7*  --   NEUTROABS 12.9*  --   --   --   --   HGB 10.8* 10.9* 11.6* 10.5* 10.2*  HCT 32.8* 32.0* 34.0* 31.7* 30.0*  MCV 98.5  --   --  96.9  --   PLT 366  --   --  336  --    PT/INR: '@LABRCNTIP'$ (inr:5) Cardiac Enzymes: ) Recent Labs  Lab 12/19/21 2115  CKTOTAL 275   CBG: Recent Labs  Lab 12/19/21 2007 12/19/21 2135  GLUCAP 149* 135*    Iron Studies: No results for input(s): "IRON", "TIBC", "TRANSFERRIN", "FERRITIN" in the last 168 hours.  Xrays/Other Studies: DG Chest Port 1 View  Result Date: 12/19/2021 CLINICAL DATA:  Short of breath, stroke EXAM: PORTABLE CHEST 1 VIEW COMPARISON:  08/19/2019 FINDINGS: Single frontal view of the chest demonstrates stable catheter tubing overlying left chest. Cardiac silhouette is unremarkable. There is increased central vascular congestion, with developing bilateral perihilar airspace disease right greater than left. No effusion or pneumothorax. No acute bony abnormalities. IMPRESSION: 1. Findings consistent with mild congestive heart failure.  Electronically Signed   By: Randa Ngo M.D.   On: 12/19/2021 21:49   US RENAL  Result Date: 12/19/2021 CLINICAL DATA:  [id number].  Renal failure. EXAM: RENAL / URINARY TRACT ULTRASOUND COMPLETE COMPARISON:  None Available. FINDINGS: Right Kidney: Renal measurements: 10.7 x 4.7 x 5.1 cm = volume: 134 mL. Echogenicity within normal limits. No mass or hydronephrosis visualized. Left Kidney: Renal measurements: 10.2 x 6.4 x 5 cm = volume: 171 mL. Echogenicity within normal limits. No mass or hydronephrosis visualized. Urinary bladder: Appears normal for degree of bladder distention. Other: None. IMPRESSION: Unremarkable renal ultrasound. Electronically Signed   By: Iven Finn M.D.   On: 12/19/2021 21:27   CT Cervical Spine Wo Contrast  Result Date: 12/19/2021 CLINICAL DATA:  Found down EXAM: CT CERVICAL SPINE WITHOUT CONTRAST TECHNIQUE: Multidetector CT imaging of the cervical spine was performed without intravenous contrast. Multiplanar CT image reconstructions were also generated. RADIATION DOSE REDUCTION: This exam was performed according to the departmental dose-optimization program which includes automated exposure control, adjustment of the mA and/or kV according to patient size and/or use of iterative reconstruction technique. COMPARISON:  01/21/2015 FINDINGS: Alignment: Alignment is grossly anatomic. Skull base and vertebrae: No acute fracture. No primary bone lesion or focal pathologic process. Soft tissues and spinal canal: No prevertebral fluid or swelling. No visible canal hematoma. Disc levels: Stable C5-6 ACDF. Bony fusion at C6-7 unchanged. Severe spondylosis at C3-4 results in significant bony encroachment upon the central canal, stable. Moderate C4-5  and C7-T1 spondylosis unchanged. Upper chest: Airway is patent. There is biapical ground-glass airspace disease. Other: Reconstructed images demonstrate no additional findings. IMPRESSION: 1. No acute cervical spine fracture. 2. Multilevel  cervical spondylosis greatest at C3-4, stable. 3. Biapical ground-glass airspace disease which may reflect edema, aspiration, or infection. Electronically Signed   By: Randa Ngo M.D.   On: 12/19/2021 20:43   CT HEAD CODE STROKE WO CONTRAST  Result Date: 12/19/2021 CLINICAL DATA:  Code stroke. Initial evaluation for neuro deficit, stroke. EXAM: CT HEAD WITHOUT CONTRAST TECHNIQUE: Contiguous axial images were obtained from the base of the skull through the vertex without intravenous contrast. RADIATION DOSE REDUCTION: This exam was performed according to the departmental dose-optimization program which includes automated exposure control, adjustment of the mA and/or kV according to patient size and/or use of iterative reconstruction technique. COMPARISON:  Prior CT from 01/21/2015. FINDINGS: Brain: Age-related cerebral atrophy with mild chronic microvascular ischemic disease. No acute intracranial hemorrhage. No acute large vessel territory infarct. No mass lesion, midline shift or mass effect. No hydrocephalus or extra-axial fluid collection. Vascular: No abnormal hyperdense vessel. Scattered vascular calcifications noted within the carotid siphons. Skull: Scalp soft tissues and calvarium demonstrate no acute finding. Generator with electrode present within the left scalp and neck. Sinuses/Orbits: Globes and orbital soft tissues demonstrate no acute finding. Paranasal sinuses are largely clear. No mastoid effusion. Other: None. ASPECTS [facility] Stroke Program Early CT Score) - Ganglionic level infarction (caudate, lentiform nuclei, internal capsule, insula, M1-M3 cortex): 7 - Supraganglionic infarction (M4-M6 cortex): 3 Total score (0-10 with 10 being normal): 10 IMPRESSION: 1. No acute intracranial abnormality. 2. ASPECTS is 10. These results were communicated to Dr. Rory Percy at 8:41 pm on 12/19/2021 by text page via the [facility] messaging system. Electronically Signed   By: Jeannine Boga M.D.   On:  12/19/2021 20:43     Assessment/Plan:  AKI - unclear etiology.  Renal US without obstruction or abnormalities.  UA with moderate blood but following catheter placement.  UNa <10, FeNa 0.19% consistent with pre-renal causes and may be dehydrated due to AMS for the past week.  His elevated BNP and CXR support pulmonary edema.  Will start acute GN workup which can also have low FeNa.  Given his significant UOP, low FeNa, low BP, and improved respiratory status will hold further IV lasix for now and follow.  BUN/Cr already improving.  No indication for dialysis at this time.  Will continue to follow closely.  Avoid nephrotoxic medications including NSAIDs and iodinated intravenous contrast exposure unless the latter is absolutely indicated. Preferred narcotic agents for pain control are hydromorphone, fentanyl, and methadone. Morphine should not be used.  Avoid Baclofen and avoid oral sodium phosphate and magnesium citrate based laxatives / bowel preps.  Continue strict Input and Output monitoring.  Will monitor the patient closely with you and intervene or adjust therapy as indicated by changes in clinical status/labs  Acute metabolic encephalopathy - negative for CVA and may be related to pain meds as well as AKI. NSTEMI - Cardiology following and ECHO ordered. Pulmonary edema - mild, seen on CXR with rising BNP.  Would also recommend repeat imaging to r/o pneumonia give elevated WBC and AMS.  PNA would also increase BNP.  He has received 3 doses of IV lasix at 40 mg and produced almost 1500 mL today.  Lasix held for now as above given low FeNa and AKI.  No peripheral edema.  May need to resume tomorrow pending repeat CXR and  labs. Hyperkalemia - improved with medical therapy and lasix. Hyponatremia - improving Anemia - new, will continue to follow.  Will order SPEP/UPEP and iron stores.   Broadus John A Porschia Willbanks 12/20/2021, 2:30 PM

## 2021-12-20 NOTE — Progress Notes (Signed)
  Echocardiogram Attempted echo but patient refused stating he recently had one at his DR.  Jefferey Pica 12/20/2021, 3:35 PM

## 2021-12-20 NOTE — ED Notes (Signed)
Pt placed on bipap per RT recommendation and MD order.

## 2021-12-20 NOTE — Hospital Course (Signed)
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 Maryruth Eve for pain management. He has recently been treated for a wound infection/abscess posterior neck with a round of Augmentin then a round of doxycycline. He has been having altered mental status and myclonic jerking for the last 24-48 hrs. Earlier on the day of admission he fell out of bed. Per his wife he was unarousable. For this reason EMS activated and patient brought to [facility] for evaluation as code stroke.     ED Course: Afebrile, 105/61 HR 74  RR 14. Patient appeared confused. Code Stroke called: CT head negative, neuro consult by Dr. Rory Percy - ruled out stroke. Lab revealed hyperkalemia at 6.2, acute renal failure with BUN 70 Cr 5.1. WBC elevated at 14.2 with 91/4/5. CT c-Spine negative but biapical ASD c/w edema, vs infection noted. CXR with central vascualr and perihilar ASD c/w CHF. Renal U/S negative. TRH called to admit for continue evaluation of new onset CHF, acute renal failure and to direct treatment.

## 2021-12-20 NOTE — ED Notes (Signed)
Patient became increasingly agitated again, yelling at this RN "I need to get up and go to the bathroom". Patient able to push himself to standing at the end of the bed. Pt difficult to redirect at this time, however this RN and NT Thuy able to have patient back in the bed. During this time, patient grabs this RN's R forearm and grabs tightly holding on and applying additional pressure to the forearm. This RN able to break free of the grasp. Patient given additional PRN agitation medication- haldol and ativan, see MAR. Explained once again for the patient's safety that he needs to remain in bed as he still has the BIPAP mask delivering O2 and a foley catheter to assist with urination. Patient settled back in bed at this time.

## 2021-12-20 NOTE — ED Notes (Signed)
PT found attempting to get out of bed to urinate. PT adamant on standing to do so. PT assisted to standing position and placed back in bed upon urinating. No other needs stated afterwards.

## 2021-12-20 NOTE — Progress Notes (Signed)
Pharmacy Antibiotic Note  Joel Hart is a 66 y.o. male admitted on 12/19/2021 with cellulitis and abscess of neck despite courses of outpt Augmentin and doxy .  Pharmacy has been consulted for vancomycin dosing.  Pt's with AKI; SCr 4.73, most recent SCr ~1 (Feb 2023).  Plan: Vancomycin '2000mg'$  IV x1; monitor SCr +/- vanc levels prior to redosing.  Weight: 98.8 kg (217 lb 13 oz)  Temp (24hrs), Avg:98.1 F (36.7 C), Min:98.1 F (36.7 C), Max:98.1 F (36.7 C)  Recent Labs  Lab 12/19/21 2034 12/19/21 2040  WBC 14.2*  --   CREATININE 4.73* 5.10*    Estimated Creatinine Clearance: 15.5 mL/min (A) (by C-G formula based on SCr of 5.1 mg/dL (H)).    Allergies  Allergen Reactions   Suboxone  [Buprenorphine Hcl-Naloxone Hcl] Other (See Comments) and Shortness Of Breath   Mirtazapine Other (See Comments)    Other reaction(s): Other     Thank you for allowing pharmacy to be a part of this patient's care.  Wynona Neat, PharmD, BCPS  12/20/2021 12:55 AM

## 2021-12-20 NOTE — ED Notes (Signed)
Pt attempting to crawl out of bed, requesting to leave, aggressive with staff. Pt removed NRb, oxygen sats at 80%, pt noncompliant with care. Admitting MD paged

## 2021-12-20 NOTE — ED Notes (Signed)
This RN arrives to room to find patient trying to get out of bed with sitter at the bedside. Pt at this time altered despite redirection. Pt keeps stating repeatedly "I need to get up to go pee in the bathroom". This RN and sitter explaining that patient has a foley catheter in place, plus he is currently on the BIPAP and there is no need to get up to use the restroom. Despite this direction, patient still attempting to get out of bed and remove Bipap machine. Patient became physically aggressive with this RN and sitter, grabbing and swatting at [place]. Other personnel to bedside and security at bedside, along with EDP Kneller also evaluating. Dr. Wyline Copas via telephone communication giving verbal order for soft wrist restraints and 0.5 mg of ativan. This RN administered ativan with no issue and applied soft wrist restraints, educating patient on the need at this time as he is endangering himself. Patient now more calm and redirectable at this time. Continuing to monitor.

## 2021-12-20 NOTE — ED Notes (Signed)
Patient transitioned off of bipap to HFNC at 7 L and O2 is stable 99% at this time.

## 2021-12-20 NOTE — ED Notes (Signed)
Pt found to have removed IV. Pt stated "just because I wanted to" when asked why he removed IV. This RN attempted to educate pt on importance of IV staying in place for treatment. Pt raising voice at this RN stating "I know you don't have to keep telling me". Pt upset stating that no one has updated him on his plan. It was explain to pt that this RN has explained admission and waiting for inpatient bed. Pt continues to state that he has not been provided with updates. This RN apologized for accusation, plan of care discussed with pt, new IV placed.

## 2021-12-20 NOTE — ED Notes (Signed)
Wife Bridgette provided w/ update.

## 2021-12-20 NOTE — Progress Notes (Signed)
ANTICOAGULATION CONSULT NOTE - Initial Consult  Pharmacy Consult for Heparin Indication: chest pain/ACS  Allergies  Allergen Reactions   Suboxone  [Buprenorphine Hcl-Naloxone Hcl] Other (See Comments) and Shortness Of Breath   Mirtazapine Other (See Comments)    Other reaction(s): Other    Patient Measurements: Weight: 98.8 kg (217 lb 13 oz) Heparin Dosing Weight: 85 kg  Vital Signs: Temp: 97.5 F (36.4 C) (08/09 0204) Temp Source: Temporal (08/09 0204) BP: 132/61 (08/08 2315) Pulse Rate: 70 (08/09 0000)  Labs: Recent Labs    12/19/21 2034 12/19/21 2040 12/19/21 2112 12/19/21 2115 12/19/21 2209  HGB 10.8* 10.9*  --   --  11.6*  HCT 32.8* 32.0*  --   --  34.0*  PLT 366  --   --   --   --   APTT 23*  --   --   --   --   LABPROT 14.9  --   --   --   --   INR 1.2  --   --   --   --   CREATININE 4.73* 5.10*  --   --   --   CKTOTAL  --   --   --  275  --   TROPONINIHS  --   --  974*  --   --     Estimated Creatinine Clearance: 15.5 mL/min (A) (by C-G formula based on SCr of 5.1 mg/dL (H)).   Medical History: Past Medical History:  Diagnosis Date   Anxiety    Arthritis    Cancer ([place])    testicular   Depression    GERD (gastroesophageal reflux disease)    High triglycerides 12/19/2021   Hyperlipidemia    Hypertension    Sleep apnea    uses CPAP   Trigeminus neuralgia     Medications:  No current facility-administered medications on file prior to encounter.   Current Outpatient Medications on File Prior to Encounter  Medication Sig Dispense Refill   clonazePAM (KLONOPIN) 2 MG tablet TAKE 1 TABLET (2 MG) BY MOUTH 2 TIMES DAILY PRN FOR ANXIETY. DO NOT TAKE IN COMBINATION WITH OPIOID PAIN MEDICATION(S).     clonazePAM (KLONOPIN) 2 MG tablet Take 1 tablet (2 mg total) by mouth 2 (two) times daily for 3 days. 6 tablet 0   cloNIDine (CATAPRES) 0.2 MG tablet Take 1 tablet by mouth at bedtime.     Cyanocobalamin (VITAMIN B-12 PO) Take 1 tablet by mouth daily.      folic acid (FOLVITE) 1 MG tablet Take 1 mg by mouth daily.     gabapentin (NEURONTIN) 800 MG tablet Take 1 tablet by mouth 4 (four) times daily.     gabapentin (NEURONTIN) 800 MG tablet Take by mouth.     morphine (MS CONTIN) 30 MG 12 hr tablet Take 1 tablet by mouth 3 (three) times daily.     PARoxetine (PAXIL) 30 MG tablet Take 30 mg by mouth daily.     tiZANidine (ZANAFLEX) 4 MG tablet Take 4 mg by mouth every 12 (twelve) hours as needed.     verapamil (COVERA HS) 240 MG (CO) 24 hr tablet Take 240 mg by mouth at bedtime.       Assessment: 66 y.o. male with CHF, elevated troponin, for heparin  Heparin 5000 units SQ given ~ midnight  Goal of Therapy:  Heparin level 0.3-0.7 units/ml Monitor platelets by anticoagulation protocol: Yes   Plan:  Start heparin 1200 units/hr Check heparin level in 8 hours.  Caryl Pina 12/20/2021,2:21 AM

## 2021-12-20 NOTE — Progress Notes (Signed)
RT notified MD of patient on bipap with wrist restraints at this time. Sitter, Regina NT present at bedside at this time. Education provided to NT. NT able to verbalize and edemonstrate education/ability to take off bipap mask at this time and notified to yell/call for help. Per Dr. Wyline Copas RN is to be evaluating pt needs for restraints at this time. RT will continue to monitor and be available as needed.

## 2021-12-20 NOTE — ED Notes (Signed)
Paged Dr Wyline Copas for RN Luiz Iron at 8:46

## 2021-12-20 NOTE — Progress Notes (Signed)
Pt has PRN BIPAP orders, pt currently on 7L HFNC. No distress noted at this time.

## 2021-12-20 NOTE — ED Notes (Signed)
MD Be[place] notified of critical troponin

## 2021-12-20 NOTE — Consult Note (Addendum)
Cardiology Consultation:   Patient ID: Joel Hart MRN: [id number]; DOB: [birth year]  Admit date: 12/19/2021 Date of Consult: 12/20/2021  PCP:  Merryl Hacker, No   [facility] Providers Cardiologist:  None    Patient Profile:   Joel Hart is a 66 y.o. male with a hx of HTN, HLD, OSA on Cpap, chronic pain, recent wound infection/neck abscess and trigeminal/occipital neuralgia who is being seen 12/20/2021 for the evaluation of NSTEMI at the request of Dr. Wyline Copas.  History of Present Illness:   Joel Hart is a 66 year old male with past medical history unremarkable.  Does not appear that he has ever been seen by cardiology in the past.   History is limited as he is somewhat altered at the time of interview.  Information obtained from chart.  He was brought to the ED on 8/8 with complaints of slurred/abnormal speech, vomiting and possible left-sided gaze.  He was called a code stroke in the field.  He was evaluated by neurology on arrival to the ED.  It was felt his presentation was likely due to myoclonus and altered mental status caused by use of Neurontin in the setting of AKI.  Labs in the ED showed sodium 133, potassium 6.4, creatinine 4.7 BUN 59, BNP 1080>> 1543, high-sensitivity troponin 974>> 1869>> 4101, WBC 14.2, hemoglobin 10.8.  CT cervical spine with no acute finding, biapical groundglass airspace opacities for possible edema, aspiration infection.  Chest x-ray with mild congestive heart failure.  EKG showed sinus rhythm, 72 bpm, slight ST depression in anterior lateral leads.  Admitted to internal medicine for further management.  Started on IV Lasix 40 mg every 6 hours, along with IV heparin.  Echocardiogram pending.  Cardiology now asked to evaluate in the setting of non-STEMI.  He denies any chest pain prior to admission, does complain of increasing shortness of breath.  Denies any lower extremity edema, dizziness, palpitations or lightheadedness.   Past Medical History:  Diagnosis  Date   Anxiety    Arthritis    Cancer ([place])    testicular   Depression    GERD (gastroesophageal reflux disease)    High triglycerides 12/19/2021   Hyperlipidemia    Hypertension    Sleep apnea    uses CPAP   Trigeminus neuralgia     Past Surgical History:  Procedure Laterality Date   BACK SURGERY     BRAIN SURGERY     times 2   CERVICAL FUSION     COLON SURGERY     from parasite    SPINAL CORD STIMULATOR INSERTION N/A 10/13/2013   Procedure: Supraorbital Peripheral nerve stimulator with Dr. Vertell Limber assisting;  Surgeon: Bonna Gains, MD;  Location: [facility] NEURO ORS;  Service: Neurosurgery;  Laterality: N/A;   SURGERY SCROTAL / TESTICULAR       Home Medications:  Prior to Admission medications   Medication Sig Start Date End Date Taking? Authorizing Provider  clonazePAM (KLONOPIN) 2 MG tablet TAKE 1 TABLET (2 MG) BY MOUTH 2 TIMES DAILY PRN FOR ANXIETY. DO NOT TAKE IN COMBINATION WITH OPIOID PAIN MEDICATION(S). 10/30/17   [provider]  clonazePAM (KLONOPIN) 2 MG tablet Take 1 tablet (2 mg total) by mouth 2 (two) times daily for 3 days. 08/16/21 08/19/21  Eustaquio Maize, PA-C  cloNIDine (CATAPRES) 0.2 MG tablet Take 1 tablet by mouth at bedtime. 01/28/17   [provider]  Cyanocobalamin (VITAMIN B-12 PO) Take 1 tablet by mouth daily.    [provider]  folic acid (FOLVITE) 1  MG tablet Take 1 mg by mouth daily. 12/17/18   [provider]  gabapentin (NEURONTIN) 800 MG tablet Take 1 tablet by mouth 4 (four) times daily. 02/03/19   [provider]  gabapentin (NEURONTIN) 800 MG tablet Take by mouth. 08/17/19   [provider]  morphine (MS CONTIN) 30 MG 12 hr tablet Take 1 tablet by mouth 3 (three) times daily. 02/06/19   [provider]  PARoxetine (PAXIL) 30 MG tablet Take 30 mg by mouth daily. 02/08/19   [provider]  tiZANidine (ZANAFLEX) 4 MG tablet Take 4 mg by mouth every 12 (twelve) hours as needed. 02/13/19    [provider]  verapamil (COVERA HS) 240 MG (CO) 24 hr tablet Take 240 mg by mouth at bedtime.    [provider]    Inpatient Medications: Scheduled Meds:  amLODipine  10 mg Oral Daily   vitamin B-12  1,000 mcg Oral Daily   folic acid  1 mg Oral Daily   furosemide  40 mg Intravenous Q6H   gabapentin  300 mg Oral TID   PARoxetine  30 mg Oral Daily   senna  1 tablet Oral BID   sodium chloride flush  3 mL Intravenous Once   vancomycin variable dose per unstable renal function (pharmacist dosing)   Does not apply See admin instructions   Continuous Infusions:  sodium chloride     heparin 1,200 Units/hr (12/20/21 0348)   PRN Meds: clonazePAM, haloperidol lactate, LORazepam, morphine, ondansetron (ZOFRAN) IV, tiZANidine  Allergies:    Allergies  Allergen Reactions   Suboxone  [Buprenorphine Hcl-Naloxone Hcl] Other (See Comments) and Shortness Of Breath   Mirtazapine Other (See Comments)    Other reaction(s): Other    Social History:   Social History   Socioeconomic History   Marital status: Married    Spouse name: Not on file   Number of children: Not on file   Years of education: Not on file   Highest education level: Not on file  Occupational History   Not on file  Tobacco Use   Smoking status: Never   Smokeless tobacco: Never  Substance and Sexual Activity   Alcohol use: Yes    Comment: one drink daily   Drug use: Yes    Types: Marijuana    Comment: uses daily   Sexual activity: Not on file  Other Topics Concern   Not on file  Social History Narrative   Left handed   2 story home   Social Determinants of Health   Financial Resource Strain: Not on file  Food Insecurity: Not on file  Transportation Needs: Not on file  Physical Activity: Not on file  Stress: Not on file  Social Connections: Not on file  Intimate Partner Violence: Not on file    Family History:    Family History  Problem Relation Age of Onset   Hypertension Father       ROS:  Please see the history of present illness.   All other ROS reviewed and negative.     Physical Exam/Data:   Vitals:   12/20/21 0538 12/20/21 0615 12/20/21 0645 12/20/21 0700  BP:  129/66 114/82   Pulse:  79 76 79  Resp:  '17 12 17  '$ Temp: 98.9 F (37.2 C)     TempSrc: Oral     SpO2:  94% (!) 89% 99%  Weight:        Intake/Output Summary (Last 24 hours) at 12/20/2021 0813 Last data  filed at 12/20/2021 0657 Gross per 24 hour  Intake --  Output 250 ml  Net -250 ml      12/19/2021    8:14 PM 08/16/2021    2:24 PM 08/24/2019   11:02 AM  Last 3 Weights  Weight (lbs) 217 lb 13 oz 205 lb 208 lb  Weight (kg) 98.8 kg 92.987 kg 94.348 kg     Body mass index is 34.11 kg/m.  General: Sitting up in bed, on BiPAP, appears comfortable HEENT: normal Neck: difficult to assess JVD Vascular: No carotid bruits; Distal pulses 2+ bilaterally Cardiac:  normal S1, S2; RRR; no murmur  Lungs: Crackles on anterior exam Abd: soft, nontender, no hepatomegaly  Ext: no edema Musculoskeletal:  No deformities, BUE and BLE strength normal and equal Skin: warm and dry  Neuro:  CNs 2-12 intact, no focal abnormalities noted Psych:  Normal affect   EKG:  The EKG was personally reviewed and demonstrates:  sinus rhythm, 72 bpm, slight ST depression in anterior lateral leads Telemetry:  Telemetry was personally reviewed and demonstrates: Sinus rhythm  Relevant CV Studies:  Echo: pending  Laboratory Data:  High Sensitivity Troponin:   Recent Labs  Lab 12/19/21 2112 12/20/21 0227 12/20/21 0536  TROPONINIHS 974* 1,869* 4,101*     Chemistry Recent Labs  Lab 12/19/21 2034 12/19/21 2040 12/19/21 2112 12/19/21 2209 12/20/21 0227 12/20/21 0651  NA 133* 131*  --  131* 137 133*  K 6.4* 6.2*   < > 6.1* 4.6 4.7  CL 100 101  --   --  100  --   CO2 20*  --   --   --  24  --   GLUCOSE 164* 156*  --   --  128*  --   BUN 59* 70*  --   --  57*  --   CREATININE 4.73* 5.10*  --   --  4.58*  --    CALCIUM 8.0*  --   --   --  8.2*  --   GFRNONAA 13*  --   --   --  13*  --   ANIONGAP 13  --   --   --  13  --    < > = values in this interval not displayed.    Recent Labs  Lab 12/19/21 2034  PROT 6.4*  ALBUMIN 3.5  AST 39  ALT 24  ALKPHOS 70  BILITOT 0.5   Lipids No results for input(s): "CHOL", "TRIG", "HDL", "LABVLDL", "LDLCALC", "CHOLHDL" in the last 168 hours.  Hematology Recent Labs  Lab 12/19/21 2034 12/19/21 2040 12/19/21 2209 12/20/21 0227 12/20/21 0651  WBC 14.2*  --   --  15.7*  --   RBC 3.33*  --   --  3.27*  --   HGB 10.8*   < > 11.6* 10.5* 10.2*  HCT 32.8*   < > 34.0* 31.7* 30.0*  MCV 98.5  --   --  96.9  --   MCH 32.4  --   --  32.1  --   MCHC 32.9  --   --  33.1  --   RDW 13.0  --   --  12.9  --   PLT 366  --   --  336  --    < > = values in this interval not displayed.   Thyroid No results for input(s): "TSH", "FREET4" in the last 168 hours.  BNP Recent Labs  Lab 12/19/21 2034 12/20/21 0536  BNP 1,080.8*  1,543.9*    DDimer No results for input(s): "DDIMER" in the last 168 hours.   Radiology/Studies:  DG Chest Port 1 View  Result Date: 12/19/2021 CLINICAL DATA:  Short of breath, stroke EXAM: PORTABLE CHEST 1 VIEW COMPARISON:  08/19/2019 FINDINGS: Single frontal view of the chest demonstrates stable catheter tubing overlying left chest. Cardiac silhouette is unremarkable. There is increased central vascular congestion, with developing bilateral perihilar airspace disease right greater than left. No effusion or pneumothorax. No acute bony abnormalities. IMPRESSION: 1. Findings consistent with mild congestive heart failure. Electronically Signed   By: Randa Ngo M.D.   On: 12/19/2021 21:49   US RENAL  Result Date: 12/19/2021 CLINICAL DATA:  [id number].  Renal failure. EXAM: RENAL / URINARY TRACT ULTRASOUND COMPLETE COMPARISON:  None Available. FINDINGS: Right Kidney: Renal measurements: 10.7 x 4.7 x 5.1 cm = volume: 134 mL. Echogenicity within  normal limits. No mass or hydronephrosis visualized. Left Kidney: Renal measurements: 10.2 x 6.4 x 5 cm = volume: 171 mL. Echogenicity within normal limits. No mass or hydronephrosis visualized. Urinary bladder: Appears normal for degree of bladder distention. Other: None. IMPRESSION: Unremarkable renal ultrasound. Electronically Signed   By: Iven Finn M.D.   On: 12/19/2021 21:27   CT Cervical Spine Wo Contrast  Result Date: 12/19/2021 CLINICAL DATA:  Found down EXAM: CT CERVICAL SPINE WITHOUT CONTRAST TECHNIQUE: Multidetector CT imaging of the cervical spine was performed without intravenous contrast. Multiplanar CT image reconstructions were also generated. RADIATION DOSE REDUCTION: This exam was performed according to the departmental dose-optimization program which includes automated exposure control, adjustment of the mA and/or kV according to patient size and/or use of iterative reconstruction technique. COMPARISON:  01/21/2015 FINDINGS: Alignment: Alignment is grossly anatomic. Skull base and vertebrae: No acute fracture. No primary bone lesion or focal pathologic process. Soft tissues and spinal canal: No prevertebral fluid or swelling. No visible canal hematoma. Disc levels: Stable C5-6 ACDF. Bony fusion at C6-7 unchanged. Severe spondylosis at C3-4 results in significant bony encroachment upon the central canal, stable. Moderate C4-5 and C7-T1 spondylosis unchanged. Upper chest: Airway is patent. There is biapical ground-glass airspace disease. Other: Reconstructed images demonstrate no additional findings. IMPRESSION: 1. No acute cervical spine fracture. 2. Multilevel cervical spondylosis greatest at C3-4, stable. 3. Biapical ground-glass airspace disease which may reflect edema, aspiration, or infection. Electronically Signed   By: Randa Ngo M.D.   On: 12/19/2021 20:43   CT HEAD CODE STROKE WO CONTRAST  Result Date: 12/19/2021 CLINICAL DATA:  Code stroke. Initial evaluation for neuro  deficit, stroke. EXAM: CT HEAD WITHOUT CONTRAST TECHNIQUE: Contiguous axial images were obtained from the base of the skull through the vertex without intravenous contrast. RADIATION DOSE REDUCTION: This exam was performed according to the departmental dose-optimization program which includes automated exposure control, adjustment of the mA and/or kV according to patient size and/or use of iterative reconstruction technique. COMPARISON:  Prior CT from 01/21/2015. FINDINGS: Brain: Age-related cerebral atrophy with mild chronic microvascular ischemic disease. No acute intracranial hemorrhage. No acute large vessel territory infarct. No mass lesion, midline shift or mass effect. No hydrocephalus or extra-axial fluid collection. Vascular: No abnormal hyperdense vessel. Scattered vascular calcifications noted within the carotid siphons. Skull: Scalp soft tissues and calvarium demonstrate no acute finding. Generator with electrode present within the left scalp and neck. Sinuses/Orbits: Globes and orbital soft tissues demonstrate no acute finding. Paranasal sinuses are largely clear. No mastoid effusion. Other: None. ASPECTS [facility] Stroke Program Early CT Score) - Ganglionic level infarction (  caudate, lentiform nuclei, internal capsule, insula, M1-M3 cortex): 7 - Supraganglionic infarction (M4-M6 cortex): 3 Total score (0-10 with 10 being normal): 10 IMPRESSION: 1. No acute intracranial abnormality. 2. ASPECTS is 10. These results were communicated to Dr. Rory Percy at 8:41 pm on 12/19/2021 by text page via the [facility] messaging system. Electronically Signed   By: Jeannine Boga M.D.   On: 12/19/2021 20:43     Assessment and Plan:   Arhaan Chesnut is a 66 y.o. male with a hx of HTN, HLD, OSA on Cpap, chronic pain, recent wound infection/neck abscess and trigeminal/occipital neuralgia who is being seen 12/20/2021 for the evaluation of NSTEMI at the request of Dr. Wyline Copas.  NSTEMI: The setting of acute renal failure,  pulmonary edema and altered mental status.  He denies any chest pain prior to admission.  High-sensitivity troponin 974>> 1860>> 4101.  EKG with slight ST depression in anterior lateral leads.  Given his acute renal failure, he is not a candidate for cardiac catheterization at this time.  Suspect this may be some degree of demand ischemia from his acute illness.  Would continue to treat medically at this time, follow renal function --Continue IV heparin, add aspirin and statin therapy --Echo pending  Acute renal failure Hyperkalemia --Notes indicate prior creatinine around 1.3.  Noted at 5.1 this admission along with potassium of 6.2.  His hyperkalemia has been corrected while in the ED. --Nephrology consult pending  Pulmonary edema: Chest x-ray with pulmonary edema, he is volume overloaded on exam with crackles.  BNP 1543. --Currently on IV Lasix 40 mg every 6 hours, follow-up nephrology recommendations --Echo pending  Altered mental status: Initially called code stroke on arrival to the ED.  He was evaluated by neurology and felt this was more likely myoclonus or altered mental status in the setting of Neurontin use with AKI.  -- Per primary  Hypertension: History of the same, but blood pressures actually somewhat soft on admission.  -- Will hold home antihypertensives for now  Per primary Cellulitis/abscess of neck Chronic pain syndrome Depression Anxiety   Risk Assessment/Risk Scores:   New York Heart Association (NYHA) Functional Class NYHA Class III      For questions or updates, please contact [facility] Please consult [email or web address] for contact info under    Signed, Reino Bellis, NP  12/20/2021 8:13 AM   Agree with note by Reino Bellis NP-C  We were asked to see Joel Hart for non-STEMI.  He has no prior cardiac history.  Does have history of hypertension.  He was admitted with altered mental status thought to be neurologic in nature.  He does have a history  of trigeminal neuralgia.  He was found to be in renal failure.  He is currently on BiPAP.  Troponins have gradually increased up to the 4000 range.  His EKG shows subtle ST segment depression.  His exam is benign.  Will order 2D echo and follow his renal function.  At this point, it is not clear whether he will need an invasive workup.  Continue diuresis.  Renal consult pending.  Lorretta Harp, M.D., [place], [facility], [place] [address] [place], [place]  [address]  [phone] 12/20/2021 9:46 AM

## 2021-12-20 NOTE — ED Notes (Signed)
Pt attempting to get out of bed again. This RN and NT Dorothea Ogle attempted to assist pt to use urinal. Pt unsteady on feed and spilled urine on floor. Approx. 230m of output.

## 2021-12-21 ENCOUNTER — Inpatient Hospital Stay (HOSPITAL_COMMUNITY): Payer: Medicare Other

## 2021-12-21 DIAGNOSIS — I10 Essential (primary) hypertension: Secondary | ICD-10-CM

## 2021-12-21 DIAGNOSIS — J810 Acute pulmonary edema: Secondary | ICD-10-CM | POA: Diagnosis not present

## 2021-12-21 DIAGNOSIS — L03221 Cellulitis of neck: Secondary | ICD-10-CM | POA: Diagnosis not present

## 2021-12-21 DIAGNOSIS — N179 Acute kidney failure, unspecified: Secondary | ICD-10-CM | POA: Diagnosis not present

## 2021-12-21 DIAGNOSIS — I249 Acute ischemic heart disease, unspecified: Secondary | ICD-10-CM | POA: Diagnosis not present

## 2021-12-21 DIAGNOSIS — L0211 Cutaneous abscess of neck: Secondary | ICD-10-CM | POA: Diagnosis not present

## 2021-12-21 LAB — BASIC METABOLIC PANEL
Anion gap: 11 (ref 5–15)
BUN: 66 mg/dL — ABNORMAL HIGH (ref 8–23)
CO2: 25 mmol/L (ref 22–32)
Calcium: 8.1 mg/dL — ABNORMAL LOW (ref 8.9–10.3)
Chloride: 100 mmol/L (ref 98–111)
Creatinine, Ser: 4.61 mg/dL — ABNORMAL HIGH (ref 0.61–1.24)
GFR, Estimated: 13 mL/min — ABNORMAL LOW (ref >60)
Glucose, Bld: 129 mg/dL — ABNORMAL HIGH (ref 70–99)
Potassium: 4.1 mmol/L (ref 3.5–5.1)
Sodium: 136 mmol/L (ref 135–145)

## 2021-12-21 LAB — C4 COMPLEMENT: Complement C4, Body Fluid: 23 mg/dL (ref 12–38)

## 2021-12-21 LAB — C3 COMPLEMENT: C3 Complement: 124 mg/dL (ref 82–167)

## 2021-12-21 LAB — CBC
HCT: 29.8 % — ABNORMAL LOW (ref 39.0–52.0)
Hemoglobin: 10 g/dL — ABNORMAL LOW (ref 13.0–17.0)
MCH: 32.3 pg (ref 26.0–34.0)
MCHC: 33.6 g/dL (ref 30.0–36.0)
MCV: 96.1 fL (ref 80.0–100.0)
Platelets: 252 10*3/uL (ref 150–400)
RBC: 3.1 MIL/uL — ABNORMAL LOW (ref 4.22–5.81)
RDW: 13.1 % (ref 11.5–15.5)
WBC: 14.5 10*3/uL — ABNORMAL HIGH (ref 4.0–10.5)
nRBC: 0 % (ref 0.0–0.2)

## 2021-12-21 LAB — KAPPA/LAMBDA LIGHT CHAINS
Kappa free light chain: 40.3 mg/L — ABNORMAL HIGH (ref 3.3–19.4)
Kappa, lambda light chain ratio: 1.45 (ref 0.26–1.65)
Lambda free light chains: 27.8 mg/L — ABNORMAL HIGH (ref 5.7–26.3)

## 2021-12-21 LAB — FERRITIN: Ferritin: 237 ng/mL (ref 24–336)

## 2021-12-21 LAB — HEPARIN LEVEL (UNFRACTIONATED): Heparin Unfractionated: 0.24 IU/mL — ABNORMAL LOW (ref 0.30–0.70)

## 2021-12-21 LAB — IRON AND TIBC
Iron: 13 ug/dL — ABNORMAL LOW (ref 45–182)
Saturation Ratios: 4 % — ABNORMAL LOW (ref 17.9–39.5)
TIBC: 293 ug/dL (ref 250–450)
UIBC: 280 ug/dL

## 2021-12-21 LAB — URINE CULTURE: Culture: NO GROWTH

## 2021-12-21 LAB — ANTI-DNA ANTIBODY, DOUBLE-STRANDED: ds DNA Ab: <1 IU/mL (ref 0–9)

## 2021-12-21 LAB — ANCA TITERS
Atypical P-ANCA titer: <1:20 {titer}
C-ANCA: <1:20 {titer}
P-ANCA: <1:20 {titer}

## 2021-12-21 LAB — ANA W/REFLEX IF POSITIVE: Anti Nuclear Antibody (ANA): NEGATIVE

## 2021-12-21 MED ORDER — SODIUM CHLORIDE 0.9 % IV SOLN
2.0000 g | INTRAVENOUS | Status: DC
  Administered 2021-12-21 – 2021-12-23 (×3): 2 g via INTRAVENOUS
  Filled 2021-12-21 (×4): qty 20

## 2021-12-21 MED ORDER — ASPIRIN 81 MG PO TBEC
81.0000 mg | DELAYED_RELEASE_TABLET | Freq: Every day | ORAL | Status: DC
  Administered 2021-12-21 – 2021-12-24 (×4): 81 mg via ORAL
  Filled 2021-12-21 (×4): qty 1

## 2021-12-21 MED ORDER — FUROSEMIDE 10 MG/ML IJ SOLN
40.0000 mg | Freq: Two times a day (BID) | INTRAMUSCULAR | Status: DC
  Administered 2021-12-21 – 2021-12-22 (×3): 40 mg via INTRAVENOUS
  Filled 2021-12-21 (×3): qty 4

## 2021-12-21 MED ORDER — MORPHINE SULFATE (PF) 4 MG/ML IV SOLN
4.0000 mg | INTRAVENOUS | Status: DC | PRN
  Administered 2021-12-22 – 2021-12-24 (×6): 4 mg via INTRAVENOUS
  Filled 2021-12-21 (×7): qty 1

## 2021-12-21 MED ORDER — SODIUM CHLORIDE 0.9 % IV SOLN
500.0000 mg | INTRAVENOUS | Status: DC
  Administered 2021-12-21 – 2021-12-22 (×2): 500 mg via INTRAVENOUS
  Filled 2021-12-21 (×3): qty 5

## 2021-12-21 MED ORDER — ROSUVASTATIN CALCIUM 20 MG PO TABS
10.0000 mg | ORAL_TABLET | Freq: Every day | ORAL | Status: DC
  Administered 2021-12-21 – 2021-12-24 (×4): 10 mg via ORAL
  Filled 2021-12-21 (×4): qty 1

## 2021-12-21 NOTE — Progress Notes (Signed)
TRH night cross cover note:   I was notified by RN of the patient's request to change prn IV Dilaudid to as needed IV morphine for generalized discomfort.  I subsequently made this requested change.     Babs Bertin, DO Hospitalist

## 2021-12-21 NOTE — TOC Initial Note (Addendum)
Transition of Care [facility]) - Initial/Assessment Note    Patient Details  Name: Joel Hart MRN: [id number] Date of Birth: 03-19-1956  Transition of Care [facility]) CM/SW Contact:    Bethena Roys, RN Phone Number: 12/21/2021, 4:23 PM  Clinical Narrative:  Risk for readmission assessment completed. Patient presented for code stroke activation. PTA patient was independent from home with spouse. Spouse states patient has a cane and does not use it. Patient has had several CPAP's and refuses to get another one. Patient will benefit from PT/OT prior to transition home. Case Manager will continue to follow for disposition needs as he progresses.                  Expected Discharge Plan: [place] Barriers to Discharge: Continued Medical Work up Expected Discharge Plan and Services Expected Discharge Plan: [place]   Discharge Planning Services: CM Consult   Living arrangements for the past 2 months: Single Family Home  Prior Living Arrangements/Services Living arrangements for the past 2 months: Single Family Home Lives with:: Spouse Patient language and need for interpreter reviewed:: Yes        Need for Family Participation in Patient Care: Yes (Comment) Care giver support system in place?: Yes (comment) Current home services: DME (cane) Criminal Activity/Legal Involvement Pertinent to Current Situation/Hospitalization: No - Comment as needed  Activities of Daily Living Home Assistive Devices/Equipment: CPAP ADL Screening (condition at time of admission) Patient's cognitive ability adequate to safely complete daily activities?: Yes Is the patient deaf or have difficulty hearing?: No Does the patient have difficulty seeing, even when wearing glasses/contacts?: Yes Does the patient have difficulty concentrating, remembering, or making decisions?: Yes Patient able to express need for assistance with ADLs?: Yes Does the patient have  difficulty dressing or bathing?: No Independently performs ADLs?: Yes (appropriate for developmental age) Does the patient have difficulty walking or climbing stairs?: Yes Weakness of Legs: Both Weakness of Arms/Hands: Both    Emotional Assessment Appearance:: Appears stated age       Alcohol / Substance Use: Not Applicable Psych Involvement: No (comment)  Admission diagnosis:  Uremia [N19] Acute renal failure ([place]) [N17.9] ACS (acute coronary syndrome) ([place]) [I24.9] AKI (acute kidney injury) ([place]) [N17.9] Patient Active Problem List   Diagnosis Date Noted   ACS (acute coronary syndrome) ([place]) 12/20/2021   HTN (hypertension) 12/19/2021   Acute hyperkalemia 12/19/2021   Cellulitis and abscess of neck 12/19/2021   Acute renal failure ([place]) 12/19/2021   Pulmonary edema 12/19/2021   Weakness of both lower extremities 03/30/2019   Healthcare maintenance 03/30/2019   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   Depression with anxiety 03/02/2019   Chronic pain syndrome 03/02/2019   Trigeminal neuralgia 03/02/2019   Occipital neuralgia 03/02/2019   PCP:  Merryl Hacker, No Pharmacy:   [place], [address] [place] [address] Phone: [phone] Fax: [place], [place] - [address] [place] [address] Phone: [phone] Fax: [phone]  Readmission Risk Interventions    12/21/2021    4:23 PM  Readmission Risk Prevention Plan  Transportation Screening Complete  PCP or Specialist Appt within 3-5 Days Complete  HRI or Home Care Consult Complete  Social Work Consult for [place] Planning/Counseling Complete  Palliative Care Screening Not Applicable  Medication Review Press photographer) Referral to Pharmacy

## 2021-12-21 NOTE — Progress Notes (Signed)
[place] for Heparin Indication: chest pain/ACS  Allergies  Allergen Reactions   Suboxone  [Buprenorphine Hcl-Naloxone Hcl] Other (See Comments) and Shortness Of Breath   Mirtazapine Other (See Comments)    Other reaction(s): Other    Patient Measurements: Height: '5\' 7"'$  (170.2 cm) Weight: 98.8 kg (217 lb 13 oz) IBW/kg (Calculated) : 66.1 Heparin Dosing Weight: 87.5 kg  Vital Signs: Temp: 98.8 F (37.1 C) (08/10 0739) Temp Source: Oral (08/10 0739) BP: 128/50 (08/10 0739) Pulse Rate: 93 (08/10 0739)  Labs: Recent Labs    12/19/21 2034 12/19/21 2040 12/19/21 2112 12/19/21 2115 12/19/21 2209 12/20/21 0227 12/20/21 0536 12/20/21 0651 12/20/21 1004 12/20/21 1953 12/21/21 0604  HGB 10.8* 10.9*  --   --    < > 10.5*  --  10.2*  --   --  10.0*  HCT 32.8* 32.0*  --   --    < > 31.7*  --  30.0*  --   --  29.8*  PLT 366  --   --   --   --  336  --   --   --   --  252  APTT 23*  --   --   --   --   --   --   --   --   --   --   LABPROT 14.9  --   --   --   --   --   --   --   --   --   --   INR 1.2  --   --   --   --   --   --   --   --   --   --   HEPARINUNFRC  --   --   --   --   --   --   --   --  <0.10* 0.25* 0.24*  CREATININE 4.73* 5.10*  --   --   --  4.58*  --   --   --   --  4.61*  CKTOTAL  --   --   --  275  --   --   --   --   --   --   --   TROPONINIHS  --   --  974*  --   --  1,869* 4,101*  --   --   --   --    < > = values in this interval not displayed.     Estimated Creatinine Clearance: 17.2 mL/min (A) (by C-G formula based on SCr of 4.61 mg/dL (H)).  Assessment: 66 y.o. male with continues on IV heparin for ACS.   Heparin level subtherapeutic (0.24) on infusion at 1700 units/hr. No issues with line or bleeding, cbc stable.  Goal of Therapy:  Heparin level 0.3-0.7 units/ml Monitor platelets by anticoagulation protocol: Yes   Plan: Increase heparin infusion to 1800 units/hr Daily heparin level    Bonnita Nasuti  Pharm.D. CPP, BCPS Clinical Pharmacist [phone] 12/21/2021 9:48 AM

## 2021-12-21 NOTE — Progress Notes (Addendum)
Progress Note  Patient Name: Joel Hart Date of Encounter: 12/21/2021  [facility] Cardiologist: None   Subjective   Laying in bed, resting comfortably. Sitter at the bedside.   Inpatient Medications    Scheduled Meds:  vitamin B-12  1,000 mcg Oral Daily   folic acid  1 mg Oral Daily   furosemide  40 mg Intravenous BID   gabapentin  300 mg Oral TID   PARoxetine  30 mg Oral Daily   senna  1 tablet Oral BID   sodium chloride flush  3 mL Intravenous Once   vancomycin variable dose per unstable renal function (pharmacist dosing)   Does not apply See admin instructions   Continuous Infusions:  heparin 1,700 Units/hr (12/21/21 0027)   PRN Meds: acetaminophen, clonazePAM, haloperidol lactate, LORazepam, morphine injection, ondansetron (ZOFRAN) IV, tiZANidine   Vital Signs    Vitals:   12/20/21 1909 12/20/21 2334 12/21/21 0405 12/21/21 0739  BP: 133/69 (!) 116/58 (!) 130/51 (!) 128/50  Pulse: 89 83 99 93  Resp: '13 18 17 12  '$ Temp: 98.5 F (36.9 C) 98.4 F (36.9 C) 98.4 F (36.9 C) 98.8 F (37.1 C)  TempSrc: Oral Oral Oral Oral  SpO2: 94% 99% 92% 99%  Weight:      Height:        Intake/Output Summary (Last 24 hours) at 12/21/2021 0912 Last data filed at 12/21/2021 9509 Gross per 24 hour  Intake 553.93 ml  Output 2950 ml  Net -2396.07 ml      12/20/2021   11:26 AM 12/19/2021    8:14 PM 08/16/2021    2:24 PM  Last 3 Weights  Weight (lbs) 217 lb 13 oz 217 lb 13 oz 205 lb  Weight (kg) 98.8 kg 98.8 kg 92.987 kg      Telemetry    Sinus Rhythm, PVCs - Personally Reviewed  ECG    No new tracing this morning  Physical Exam   GEN: Laying flat in bed, resting comfortably.   Neck: No JVD Cardiac: RRR, no murmurs, rubs, or gallops.  Respiratory: Crackles in bases GI: Soft, nontender, non-distended  MS: No edema; No deformity. Neuro:  Nonfocal  Psych: Alert to self  Labs    High Sensitivity Troponin:   Recent Labs  Lab 12/19/21 2112 12/20/21 0227  12/20/21 0536  TROPONINIHS 974* 1,869* 4,101*     Chemistry Recent Labs  Lab 12/19/21 2034 12/19/21 2040 12/19/21 2112 12/20/21 0227 12/20/21 0651 12/21/21 0604  NA 133* 131*   < > 137 133* 136  K 6.4* 6.2*   < > 4.6 4.7 4.1  CL 100 101  --  100  --  100  CO2 20*  --   --  24  --  25  GLUCOSE 164* 156*  --  128*  --  129*  BUN 59* 70*  --  57*  --  66*  CREATININE 4.73* 5.10*  --  4.58*  --  4.61*  CALCIUM 8.0*  --   --  8.2*  --  8.1*  PROT 6.4*  --   --   --   --   --   ALBUMIN 3.5  --   --   --   --   --   AST 39  --   --   --   --   --   ALT 24  --   --   --   --   --   ALKPHOS 70  --   --   --   --   --  BILITOT 0.5  --   --   --   --   --   GFRNONAA 13*  --   --  13*  --  13*  ANIONGAP 13  --   --  13  --  11   < > = values in this interval not displayed.    Lipids No results for input(s): "CHOL", "TRIG", "HDL", "LABVLDL", "LDLCALC", "CHOLHDL" in the last 168 hours.  Hematology Recent Labs  Lab 12/19/21 2034 12/19/21 2040 12/20/21 0227 12/20/21 0651 12/21/21 0604  WBC 14.2*  --  15.7*  --  14.5*  RBC 3.33*  --  3.27*  --  3.10*  HGB 10.8*   < > 10.5* 10.2* 10.0*  HCT 32.8*   < > 31.7* 30.0* 29.8*  MCV 98.5  --  96.9  --  96.1  MCH 32.4  --  32.1  --  32.3  MCHC 32.9  --  33.1  --  33.6  RDW 13.0  --  12.9  --  13.1  PLT 366  --  336  --  252   < > = values in this interval not displayed.   Thyroid No results for input(s): "TSH", "FREET4" in the last 168 hours.  BNP Recent Labs  Lab 12/19/21 2034 12/20/21 0536  BNP 1,080.8* 1,543.9*    DDimer No results for input(s): "DDIMER" in the last 168 hours.   Radiology    DG Chest 2 View  Result Date: 12/21/2021 CLINICAL DATA:  Encounter for CHF EXAM: CHEST - 2 VIEW COMPARISON:  Two days ago FINDINGS: Worsening diffuse hazy opacification of the chest, compatible with history of CHF. Borderline cardiomegaly and vascular pedicle widening. No visible effusion or pneumothorax. Artifact from EKG leads an  neural stimulators. IMPRESSION: Worsening airspace disease. Electronically Signed   By: Jorje Guild M.D.   On: 12/21/2021 05:37   DG Chest Port 1 View  Result Date: 12/19/2021 CLINICAL DATA:  Short of breath, stroke EXAM: PORTABLE CHEST 1 VIEW COMPARISON:  08/19/2019 FINDINGS: Single frontal view of the chest demonstrates stable catheter tubing overlying left chest. Cardiac silhouette is unremarkable. There is increased central vascular congestion, with developing bilateral perihilar airspace disease right greater than left. No effusion or pneumothorax. No acute bony abnormalities. IMPRESSION: 1. Findings consistent with mild congestive heart failure. Electronically Signed   By: Randa Ngo M.D.   On: 12/19/2021 21:49   US RENAL  Result Date: 12/19/2021 CLINICAL DATA:  [id number].  Renal failure. EXAM: RENAL / URINARY TRACT ULTRASOUND COMPLETE COMPARISON:  None Available. FINDINGS: Right Kidney: Renal measurements: 10.7 x 4.7 x 5.1 cm = volume: 134 mL. Echogenicity within normal limits. No mass or hydronephrosis visualized. Left Kidney: Renal measurements: 10.2 x 6.4 x 5 cm = volume: 171 mL. Echogenicity within normal limits. No mass or hydronephrosis visualized. Urinary bladder: Appears normal for degree of bladder distention. Other: None. IMPRESSION: Unremarkable renal ultrasound. Electronically Signed   By: Iven Finn M.D.   On: 12/19/2021 21:27   CT Cervical Spine Wo Contrast  Result Date: 12/19/2021 CLINICAL DATA:  Found down EXAM: CT CERVICAL SPINE WITHOUT CONTRAST TECHNIQUE: Multidetector CT imaging of the cervical spine was performed without intravenous contrast. Multiplanar CT image reconstructions were also generated. RADIATION DOSE REDUCTION: This exam was performed according to the departmental dose-optimization program which includes automated exposure control, adjustment of the mA and/or kV according to patient size and/or use of iterative reconstruction technique. COMPARISON:   01/21/2015 FINDINGS: Alignment: Alignment is grossly anatomic.  Skull base and vertebrae: No acute fracture. No primary bone lesion or focal pathologic process. Soft tissues and spinal canal: No prevertebral fluid or swelling. No visible canal hematoma. Disc levels: Stable C5-6 ACDF. Bony fusion at C6-7 unchanged. Severe spondylosis at C3-4 results in significant bony encroachment upon the central canal, stable. Moderate C4-5 and C7-T1 spondylosis unchanged. Upper chest: Airway is patent. There is biapical ground-glass airspace disease. Other: Reconstructed images demonstrate no additional findings. IMPRESSION: 1. No acute cervical spine fracture. 2. Multilevel cervical spondylosis greatest at C3-4, stable. 3. Biapical ground-glass airspace disease which may reflect edema, aspiration, or infection. Electronically Signed   By: Randa Ngo M.D.   On: 12/19/2021 20:43   CT HEAD CODE STROKE WO CONTRAST  Result Date: 12/19/2021 CLINICAL DATA:  Code stroke. Initial evaluation for neuro deficit, stroke. EXAM: CT HEAD WITHOUT CONTRAST TECHNIQUE: Contiguous axial images were obtained from the base of the skull through the vertex without intravenous contrast. RADIATION DOSE REDUCTION: This exam was performed according to the departmental dose-optimization program which includes automated exposure control, adjustment of the mA and/or kV according to patient size and/or use of iterative reconstruction technique. COMPARISON:  Prior CT from 01/21/2015. FINDINGS: Brain: Age-related cerebral atrophy with mild chronic microvascular ischemic disease. No acute intracranial hemorrhage. No acute large vessel territory infarct. No mass lesion, midline shift or mass effect. No hydrocephalus or extra-axial fluid collection. Vascular: No abnormal hyperdense vessel. Scattered vascular calcifications noted within the carotid siphons. Skull: Scalp soft tissues and calvarium demonstrate no acute finding. Generator with electrode present  within the left scalp and neck. Sinuses/Orbits: Globes and orbital soft tissues demonstrate no acute finding. Paranasal sinuses are largely clear. No mastoid effusion. Other: None. ASPECTS [facility] Stroke Program Early CT Score) - Ganglionic level infarction (caudate, lentiform nuclei, internal capsule, insula, M1-M3 cortex): 7 - Supraganglionic infarction (M4-M6 cortex): 3 Total score (0-10 with 10 being normal): 10 IMPRESSION: 1. No acute intracranial abnormality. 2. ASPECTS is 10. These results were communicated to Dr. Rory Percy at 8:41 pm on 12/19/2021 by text page via the [facility] messaging system. Electronically Signed   By: Jeannine Boga M.D.   On: 12/19/2021 20:43    Cardiac Studies   Echo: pending  Patient Profile     66 y.o. male with a hx of HTN, HLD, OSA on Cpap, chronic pain, recent wound infection/neck abscess and trigeminal/occipital neuralgia who was seen 12/20/2021 for the evaluation of NSTEMI at the request of Dr. Wyline Copas.  Assessment & Plan    NSTEMI: The setting of acute renal failure, pulmonary edema and altered mental status.  He denies any chest pain prior to admission.  High-sensitivity troponin 974>> 1860>> 4101.  EKG with slight ST depression in anterior lateral leads.  Given his acute renal failure, he is not a candidate for cardiac catheterization at this time.  Suspect this may be some degree of demand ischemia from his acute illness.  Would continue to treat medically at this time, follow renal function --Continue IV heparin (total of 48hrs), aspirin and low dose statin therapy --Echo pending   Acute renal failure Hyperkalemia --Notes indicate prior creatinine around 1.3.  Noted at 5.1 this admission along with potassium of 6.2.  Cr 5.10>>4.58>>4.61 --Nephrology following   Pulmonary edema Acute hypoxic respiratory -- Chest x-ray with pulmonary edema on admission with volume overload.  BNP 1543. -- Received IV Lasix 40 mg every 6 hours, Net UOP 2.6L, reduced to '40mg'$  BID  now. CXR this morning with worsening diffuse hazy opacification.  --  Echo pending   Altered mental status: Initially called code stroke on arrival to the ED.  He was evaluated by neurology and felt this was more likely myoclonus or altered mental status in the setting of Neurontin use with AKI. Some improvement this morning.  -- Per primary   Hypertension: History of the same, but blood pressures actually somewhat soft on admission. Improved today.   Per primary Cellulitis/abscess of neck Chronic pain syndrome Depression Anxiety  For questions or updates, please contact [facility] Please consult [email or web address] for contact info under        Signed, Reino Bellis, NP  12/21/2021, 9:12 AM    Agree with note by Reino Bellis NP-C  Looks better this morning. MS has improved. BP better. Diuresing on IV lasix.Renal FXN slowly improving. Doubt increase in Trop represent ACS. 2D pending. Will continue to follow.  Lorretta Harp, M.D., [place], [facility], [place] [address] [place], [place]  [address]  [phone] 12/21/2021 2:10 PM

## 2021-12-21 NOTE — Progress Notes (Signed)
Progress Note   Patient: Joel Hart ZOX:[id number] DOB: 1955-09-04 DOA: 12/19/2021     2 DOS: the patient was seen and examined on 12/21/2021   Brief hospital course: 66yo with a long history of severe pain syndrome from trigeminal and occiptal neuralgia, followed by Dr. Maryruth Eve for pain management. He has recently been treated for a wound infection/abscess posterior neck with a round of Augmentin then a round of doxycycline. He has been having altered mental status and myclonic jerking for the last 24-48 hrs. Earlier on the day of admission he fell out of bed. Per his wife he was unarousable. For this reason EMS activated and patient brought to [facility] for evaluation as code stroke.     ED Course: Afebrile, 105/61 HR 74  RR 14. Patient appeared confused. Code Stroke called: CT head negative, neuro consult by Dr. Rory Percy - ruled out stroke. Lab revealed hyperkalemia at 6.2, acute renal failure with BUN 70 Cr 5.1. WBC elevated at 14.2 with 91/4/5. CT c-Spine negative but biapical ASD c/w edema, vs infection noted. CXR with central vascualr and perihilar ASD c/w CHF. Renal U/S negative. TRH called to admit for continue evaluation of new onset CHF, acute renal failure and to direct treatment.   Assessment and Plan: * Acute renal failure [facility]) Per old [facility] records patient carries dx of renal insufficiency since 2015 with a creatinine at that time of 1.3.  -Pt was initially given gentle hydration for possible intravascular depletion. -Given concerns of vol overload, was later given dose of IV lasix -Indwelling cath placed with very good urine output -Appreciate input by Nephrology. Recs to start acute GN work up.  -CXR appears worse despite lasix, thus lasix resumed per Nephrology -recheck BMET in AM   Pulmonary edema Patient denies any cardiac history. Denies chest pain and denies acute SOB. Wife reports he has had a cough and is sedentary. CXR and CT reveal Air space disease strongly suggestive of  pulmonary edema. BNP elevated to 1,080. EKG w/o acute changes.  -Patient presented with hypoxemia requiring 100% rebreather facemask and later BiPap -Given course of IV lasix with good output, now weaned to HFNC - 2d echo ordered by Cardiology, however pt refused exam. Pt states he is now agreeable, thus have re-ordered study -Wean O2 as tolerated -CXR reviewed, looks worse  Pneumonia -Pt with recent temp of 100.42F on 8/9 with worsening infiltrates on CXR and leukocytosis -UA clear, blood cx pending -Have transitioned abx to azithro plus rocephin to better cover PNA  NSTEMI -Trop peaked to the 4000 range -Continued on heparin gtt -Per Cards, unclear at this time if he will need invasive w/u. Cont to diurese per Nephro   Acute hyperkalemia Patient with hyperkalemia to 6.2 w/o EKG changes at presentation -resolved   Cellulitis and abscess of neck Patient with infection/abscess posterior neck at surgical site. He has had a course of augmentin and a course of doxycycline. Per wife the wound looks better, however, the area is erythematous, warm to touch, without drainage. Patient does have leukocytosis with left shift.  -initial on vanc, transitioned to above abx to better cover for possible PNA   Chronic pain syndrome Patient with trigeminal and occipatl neuralgia followed by Dr. Maryruth Eve. Has had implantable stimulators including left forehead and posterior neck. -In setting of renal failure, avoid morphine -Recs for hydromorphone, fentanyl, and methadone for pain control -Gabapentin renally dosed   Depression with anxiety Long standing problem of depression and anxiety related to chronic pain syndrome -  Continue Klonopin and SSRI   HTN (hypertension) -BP is controlled. -Continue amolodipine -Cont to hold Zestril in light of acute renal failure       Subjective: Reports feeling better this AM  Physical Exam: Vitals:   12/20/21 1126 12/21/21 0405 12/21/21 0739 12/21/21 1355   BP:  (!) 130/51 (!) 128/50 (!) 131/57  Pulse:  99 93 89  Resp:  '17 12 11  '$ Temp:  98.4 F (36.9 C) 98.8 F (37.1 C) 98.6 F (37 C)  TempSrc:  Oral Oral Oral  SpO2:  92% 99% 100%  Weight: 98.8 kg     Height: '5\' 7"'$  (1.702 m)      General exam: Conversant, in no acute distress Respiratory system: normal chest rise, clear, no audible wheezing Cardiovascular system: regular rhythm, s1-s2 Gastrointestinal system: Nondistended, nontender, pos BS Central nervous system: No seizures, no tremors Extremities: No cyanosis, no joint deformities Skin: No rashes, no pallor Psychiatry: Affect normal // no auditory hallucinations   Data Reviewed:  Labs reviewed: Na 136, K 4.1, Cr 4.61   Family Communication: Pt in room, family not at bedside  Disposition: Status is: Inpatient Remains inpatient appropriate because: Severity of illness  Planned Discharge Destination: Home    Author: Marylu Lund, MD 12/21/2021 5:18 PM  For on call review [email or web address].

## 2021-12-21 NOTE — Progress Notes (Signed)
Patient ID: Joel Hart, male   DOB: Dec 22, 1955, 66 y.o.   MRN: [id number] S:Given something for agitation and is very somnolent and not responding to voice or tactile stimuli.  O:BP (!) 128/50 (BP Location: Right Arm)   Pulse 93   Temp 98.8 F (37.1 C) (Oral)   Resp 12   Ht '5\' 7"'$  (1.702 m)   Wt 98.8 kg   SpO2 99%   BMI 34.11 kg/m   Intake/Output Summary (Last 24 hours) at 12/21/2021 1112 Last data filed at 12/21/2021 8921 Gross per 24 hour  Intake 553.93 ml  Output 2950 ml  Net -2396.07 ml   Intake/Output: I/O last 3 completed shifts: In: 553.9 [P.O.:240; I.V.:313.9] Out: 3200 [Urine:3200]  Intake/Output this shift:  No intake/output data recorded. Weight change: 0 kg JHE:RDEYCXK CVS:RRR Resp:scattered rhonchi bilaterally Abd:+BS< soft, NT/ND Ext: no edema  Recent Labs  Lab 12/19/21 2034 12/19/21 2040 12/19/21 2112 12/19/21 2209 12/20/21 0227 12/20/21 0651 12/21/21 0604  NA 133* 131*  --  131* 137 133* 136  K 6.4* 6.2* 4.9 6.1* 4.6 4.7 4.1  CL 100 101  --   --  100  --  100  CO2 20*  --   --   --  24  --  25  GLUCOSE 164* 156*  --   --  128*  --  129*  BUN 59* 70*  --   --  57*  --  66*  CREATININE 4.73* 5.10*  --   --  4.58*  --  4.61*  ALBUMIN 3.5  --   --   --   --   --   --   CALCIUM 8.0*  --   --   --  8.2*  --  8.1*  AST 39  --   --   --   --   --   --   ALT 24  --   --   --   --   --   --    Liver Function Tests: Recent Labs  Lab 12/19/21 2034  AST 39  ALT 24  ALKPHOS 70  BILITOT 0.5  PROT 6.4*  ALBUMIN 3.5   No results for input(s): "LIPASE", "AMYLASE" in the last 168 hours. Recent Labs  Lab 12/19/21 2039  AMMONIA 25   CBC: Recent Labs  Lab 12/19/21 2034 12/19/21 2040 12/20/21 0227 12/20/21 0651 12/21/21 0604  WBC 14.2*  --  15.7*  --  14.5*  NEUTROABS 12.9*  --   --   --   --   HGB 10.8*   < > 10.5* 10.2* 10.0*  HCT 32.8*   < > 31.7* 30.0* 29.8*  MCV 98.5  --  96.9  --  96.1  PLT 366  --  336  --  252   < > = values in this  interval not displayed.   Cardiac Enzymes: Recent Labs  Lab 12/19/21 2115  CKTOTAL 275   CBG: Recent Labs  Lab 12/19/21 2007 12/19/21 2135  GLUCAP 149* 135*    Iron Studies:  Recent Labs    12/21/21 0604  IRON 13*  TIBC 293  FERRITIN 237   Studies/Results: DG Chest 2 View  Result Date: 12/21/2021 CLINICAL DATA:  Encounter for CHF EXAM: CHEST - 2 VIEW COMPARISON:  Two days ago FINDINGS: Worsening diffuse hazy opacification of the chest, compatible with history of CHF. Borderline cardiomegaly and vascular pedicle widening. No visible effusion or pneumothorax. Artifact from EKG leads an neural stimulators. IMPRESSION:  Worsening airspace disease. Electronically Signed   By: Jorje Guild M.D.   On: 12/21/2021 05:37   DG Chest Port 1 View  Result Date: 12/19/2021 CLINICAL DATA:  Short of breath, stroke EXAM: PORTABLE CHEST 1 VIEW COMPARISON:  08/19/2019 FINDINGS: Single frontal view of the chest demonstrates stable catheter tubing overlying left chest. Cardiac silhouette is unremarkable. There is increased central vascular congestion, with developing bilateral perihilar airspace disease right greater than left. No effusion or pneumothorax. No acute bony abnormalities. IMPRESSION: 1. Findings consistent with mild congestive heart failure. Electronically Signed   By: Randa Ngo M.D.   On: 12/19/2021 21:49   US RENAL  Result Date: 12/19/2021 CLINICAL DATA:  [id number].  Renal failure. EXAM: RENAL / URINARY TRACT ULTRASOUND COMPLETE COMPARISON:  None Available. FINDINGS: Right Kidney: Renal measurements: 10.7 x 4.7 x 5.1 cm = volume: 134 mL. Echogenicity within normal limits. No mass or hydronephrosis visualized. Left Kidney: Renal measurements: 10.2 x 6.4 x 5 cm = volume: 171 mL. Echogenicity within normal limits. No mass or hydronephrosis visualized. Urinary bladder: Appears normal for degree of bladder distention. Other: None. IMPRESSION: Unremarkable renal ultrasound. Electronically  Signed   By: Iven Finn M.D.   On: 12/19/2021 21:27   CT Cervical Spine Wo Contrast  Result Date: 12/19/2021 CLINICAL DATA:  Found down EXAM: CT CERVICAL SPINE WITHOUT CONTRAST TECHNIQUE: Multidetector CT imaging of the cervical spine was performed without intravenous contrast. Multiplanar CT image reconstructions were also generated. RADIATION DOSE REDUCTION: This exam was performed according to the departmental dose-optimization program which includes automated exposure control, adjustment of the mA and/or kV according to patient size and/or use of iterative reconstruction technique. COMPARISON:  01/21/2015 FINDINGS: Alignment: Alignment is grossly anatomic. Skull base and vertebrae: No acute fracture. No primary bone lesion or focal pathologic process. Soft tissues and spinal canal: No prevertebral fluid or swelling. No visible canal hematoma. Disc levels: Stable C5-6 ACDF. Bony fusion at C6-7 unchanged. Severe spondylosis at C3-4 results in significant bony encroachment upon the central canal, stable. Moderate C4-5 and C7-T1 spondylosis unchanged. Upper chest: Airway is patent. There is biapical ground-glass airspace disease. Other: Reconstructed images demonstrate no additional findings. IMPRESSION: 1. No acute cervical spine fracture. 2. Multilevel cervical spondylosis greatest at C3-4, stable. 3. Biapical ground-glass airspace disease which may reflect edema, aspiration, or infection. Electronically Signed   By: Randa Ngo M.D.   On: 12/19/2021 20:43   CT HEAD CODE STROKE WO CONTRAST  Result Date: 12/19/2021 CLINICAL DATA:  Code stroke. Initial evaluation for neuro deficit, stroke. EXAM: CT HEAD WITHOUT CONTRAST TECHNIQUE: Contiguous axial images were obtained from the base of the skull through the vertex without intravenous contrast. RADIATION DOSE REDUCTION: This exam was performed according to the departmental dose-optimization program which includes automated exposure control, adjustment of  the mA and/or kV according to patient size and/or use of iterative reconstruction technique. COMPARISON:  Prior CT from 01/21/2015. FINDINGS: Brain: Age-related cerebral atrophy with mild chronic microvascular ischemic disease. No acute intracranial hemorrhage. No acute large vessel territory infarct. No mass lesion, midline shift or mass effect. No hydrocephalus or extra-axial fluid collection. Vascular: No abnormal hyperdense vessel. Scattered vascular calcifications noted within the carotid siphons. Skull: Scalp soft tissues and calvarium demonstrate no acute finding. Generator with electrode present within the left scalp and neck. Sinuses/Orbits: Globes and orbital soft tissues demonstrate no acute finding. Paranasal sinuses are largely clear. No mastoid effusion. Other: None. ASPECTS [facility] Stroke Program Early CT Score) - Ganglionic level infarction (  caudate, lentiform nuclei, internal capsule, insula, M1-M3 cortex): 7 - Supraganglionic infarction (M4-M6 cortex): 3 Total score (0-10 with 10 being normal): 10 IMPRESSION: 1. No acute intracranial abnormality. 2. ASPECTS is 10. These results were communicated to Dr. Rory Percy at 8:41 pm on 12/19/2021 by text page via the [facility] messaging system. Electronically Signed   By: Jeannine Boga M.D.   On: 12/19/2021 20:43    aspirin EC  81 mg Oral Daily   vitamin B-12  1,000 mcg Oral Daily   folic acid  1 mg Oral Daily   furosemide  40 mg Intravenous BID   gabapentin  300 mg Oral TID   PARoxetine  30 mg Oral Daily   rosuvastatin  10 mg Oral Daily   senna  1 tablet Oral BID   sodium chloride flush  3 mL Intravenous Once   vancomycin variable dose per unstable renal function (pharmacist dosing)   Does not apply See admin instructions    BMET    Component Value Date/Time   NA 136 12/21/2021 0604   NA 139 04/30/2013 1622   K 4.1 12/21/2021 0604   K 4.5 04/30/2013 1622   CL 100 12/21/2021 0604   CL 104 04/30/2013 1622   CO2 25 12/21/2021 0604   CO2 29  04/30/2013 1622   GLUCOSE 129 (H) 12/21/2021 0604   GLUCOSE 109 (H) 04/30/2013 1622   BUN 66 (H) 12/21/2021 0604   BUN 15 04/30/2013 1622   CREATININE 4.61 (H) 12/21/2021 0604   CREATININE 1.33 (H) 04/30/2013 1622   CALCIUM 8.1 (L) 12/21/2021 0604   CALCIUM 9.4 04/30/2013 1622   GFRNONAA 13 (L) 12/21/2021 0604   GFRNONAA 58 (L) 04/30/2013 1622   GFRAA 63 (L) 10/08/2013 1023   GFRAA >60 04/30/2013 1622   CBC    Component Value Date/Time   WBC 14.5 (H) 12/21/2021 0604   RBC 3.10 (L) 12/21/2021 0604   HGB 10.0 (L) 12/21/2021 0604   HCT 29.8 (L) 12/21/2021 0604   PLT 252 12/21/2021 0604   MCV 96.1 12/21/2021 0604   MCH 32.3 12/21/2021 0604   MCHC 33.6 12/21/2021 0604   RDW 13.1 12/21/2021 0604   LYMPHSABS 0.6 (L) 12/19/2021 2034   MONOABS 0.7 12/19/2021 2034   EOSABS 0.0 12/19/2021 2034   BASOSABS 0.0 12/19/2021 2034    Assessment/Plan:  AKI - unclear etiology.  Renal US without obstruction or abnormalities.  UA with moderate blood but following catheter placement.  UNa <10, FeNa 0.19% consistent with pre-renal causes and may be dehydrated due to AMS for the past week.  His elevated BNP and CXR support pulmonary edema.  Will start acute GN workup which can also have low FeNa.  Given his significant UOP, low FeNa, low BP, and improved respiratory status will hold further IV lasix for now and follow.  BUN/Cr already improving-stable.  No indication for dialysis at this time.  Will continue to follow closely.  Avoid nephrotoxic medications including NSAIDs and iodinated intravenous contrast exposure unless the latter is absolutely indicated. Preferred narcotic agents for pain control are hydromorphone, fentanyl, and methadone. Morphine should not be used.  Avoid Baclofen and avoid oral sodium phosphate and magnesium citrate based laxatives / bowel preps.  Continue strict Input and Output monitoring.  Will monitor the patient closely with you and intervene or adjust therapy as indicated  by changes in clinical status/labs  Acute metabolic encephalopathy - negative for CVA and may be related to pain meds as well as AKI. NSTEMI -  Cardiology following and ECHO ordered. Pulmonary edema - mild, seen on CXR with rising BNP.  Would also recommend repeat imaging to r/o pneumonia give elevated WBC and AMS.  PNA would also increase BNP.  He has received 3 doses of IV lasix at 40 mg and produced almost 1500 mL today.  Lasix held for now as above given low FeNa and AKI.  No peripheral edema.  diuresed 2.6 liters overnight but CXR this morning is worsening diffuse hazy opacification.  I am concerned that this may actually be a pneumonia.  Awaiting ECHO, he refused yesterday.  Cardiology following.  I did decrease furosemide to 40 mg bid given vigorous diuresis.  Hyperkalemia - improved with medical therapy and lasix. Hyponatremia - improving Anemia - new, will continue to follow.  Will order SPEP/UPEP and iron stores.    Donetta Potts, MD [facility]

## 2021-12-22 ENCOUNTER — Inpatient Hospital Stay (HOSPITAL_COMMUNITY): Payer: Medicare Other

## 2021-12-22 DIAGNOSIS — I50810 Right heart failure, unspecified: Secondary | ICD-10-CM

## 2021-12-22 DIAGNOSIS — I214 Non-ST elevation (NSTEMI) myocardial infarction: Secondary | ICD-10-CM | POA: Diagnosis not present

## 2021-12-22 DIAGNOSIS — N179 Acute kidney failure, unspecified: Secondary | ICD-10-CM | POA: Diagnosis not present

## 2021-12-22 DIAGNOSIS — I249 Acute ischemic heart disease, unspecified: Secondary | ICD-10-CM | POA: Diagnosis not present

## 2021-12-22 DIAGNOSIS — L03221 Cellulitis of neck: Secondary | ICD-10-CM | POA: Diagnosis not present

## 2021-12-22 DIAGNOSIS — L0211 Cutaneous abscess of neck: Secondary | ICD-10-CM | POA: Diagnosis not present

## 2021-12-22 DIAGNOSIS — I10 Essential (primary) hypertension: Secondary | ICD-10-CM | POA: Diagnosis not present

## 2021-12-22 LAB — CBC
HCT: 29.7 % — ABNORMAL LOW (ref 39.0–52.0)
Hemoglobin: 10.1 g/dL — ABNORMAL LOW (ref 13.0–17.0)
MCH: 31.8 pg (ref 26.0–34.0)
MCHC: 34 g/dL (ref 30.0–36.0)
MCV: 93.4 fL (ref 80.0–100.0)
Platelets: 256 10*3/uL (ref 150–400)
RBC: 3.18 MIL/uL — ABNORMAL LOW (ref 4.22–5.81)
RDW: 12.9 % (ref 11.5–15.5)
WBC: 12.1 10*3/uL — ABNORMAL HIGH (ref 4.0–10.5)
nRBC: 0 % (ref 0.0–0.2)

## 2021-12-22 LAB — COMPREHENSIVE METABOLIC PANEL
ALT: 26 U/L (ref 0–44)
AST: 34 U/L (ref 15–41)
Albumin: 2.9 g/dL — ABNORMAL LOW (ref 3.5–5.0)
Alkaline Phosphatase: 62 U/L (ref 38–126)
Anion gap: 12 (ref 5–15)
BUN: 61 mg/dL — ABNORMAL HIGH (ref 8–23)
CO2: 28 mmol/L (ref 22–32)
Calcium: 8.3 mg/dL — ABNORMAL LOW (ref 8.9–10.3)
Chloride: 97 mmol/L — ABNORMAL LOW (ref 98–111)
Creatinine, Ser: 4.07 mg/dL — ABNORMAL HIGH (ref 0.61–1.24)
GFR, Estimated: 15 mL/min — ABNORMAL LOW (ref >60)
Glucose, Bld: 116 mg/dL — ABNORMAL HIGH (ref 70–99)
Potassium: 3.7 mmol/L (ref 3.5–5.1)
Sodium: 137 mmol/L (ref 135–145)
Total Bilirubin: 0.5 mg/dL (ref 0.3–1.2)
Total Protein: 6 g/dL — ABNORMAL LOW (ref 6.5–8.1)

## 2021-12-22 LAB — ECHOCARDIOGRAM COMPLETE
Area-P 1/2: 3.48 cm2
Height: 67 in
S' Lateral: 3.4 cm
Weight: 3485.03 oz

## 2021-12-22 LAB — GLUCOSE, CAPILLARY: Glucose-Capillary: 162 mg/dL — ABNORMAL HIGH (ref 70–99)

## 2021-12-22 LAB — COMPLEMENT, TOTAL: Compl, Total (CH50): >60 U/mL (ref >41)

## 2021-12-22 LAB — VANCOMYCIN, TROUGH: Vancomycin Tr: 13 ug/mL — ABNORMAL LOW (ref 15–20)

## 2021-12-22 LAB — HEPARIN LEVEL (UNFRACTIONATED): Heparin Unfractionated: 0.16 IU/mL — ABNORMAL LOW (ref 0.30–0.70)

## 2021-12-22 MED ORDER — SODIUM CHLORIDE 0.9 % IV BOLUS
250.0000 mL | INTRAVENOUS | Status: AC
  Administered 2021-12-22: 250 mL via INTRAVENOUS

## 2021-12-22 MED ORDER — FUROSEMIDE 10 MG/ML IJ SOLN: 40.0000 mg | Freq: Every day | INTRAMUSCULAR | Status: DC

## 2021-12-22 MED ORDER — CHLORHEXIDINE GLUCONATE CLOTH 2 % EX PADS
6.0000 | MEDICATED_PAD | Freq: Every day | CUTANEOUS | Status: DC
  Administered 2021-12-22 – 2021-12-23 (×2): 6 via TOPICAL

## 2021-12-22 MED ORDER — HEPARIN SODIUM (PORCINE) 5000 UNIT/ML IJ SOLN
5000.0000 [IU] | Freq: Three times a day (TID) | INTRAMUSCULAR | Status: DC
  Administered 2021-12-22 – 2021-12-24 (×6): 5000 [IU] via SUBCUTANEOUS
  Filled 2021-12-22 (×5): qty 1

## 2021-12-22 MED ORDER — SODIUM CHLORIDE 0.9% FLUSH
3.0000 mL | Freq: Two times a day (BID) | INTRAVENOUS | Status: DC
  Administered 2021-12-22 – 2021-12-24 (×4): 3 mL via INTRAVENOUS

## 2021-12-22 NOTE — Progress Notes (Signed)
O2 sat ranging from 92% to 78%. Pt refusing supplemental oxygen despite multiple attempts to educate pt on importance of supplemental oxygen when satting below 88%. Pt continues to refuse. RN educated pt to take deep breaths instead of shallow breaths to keep oxygenation higher.

## 2021-12-22 NOTE — Progress Notes (Signed)
[place] for Heparin Indication: chest pain/ACS  Allergies  Allergen Reactions   Suboxone  [Buprenorphine Hcl-Naloxone Hcl] Other (See Comments) and Shortness Of Breath   Mirtazapine Other (See Comments)    Other reaction(s): Other    Patient Measurements: Height: '5\' 7"'$  (170.2 cm) Weight: 98.8 kg (217 lb 13 oz) IBW/kg (Calculated) : 66.1 Heparin Dosing Weight: 87.5 kg  Vital Signs: Temp: 100 F (37.8 C) (08/11 0350) Temp Source: Oral (08/11 0350) BP: 113/66 (08/11 0350) Pulse Rate: 90 (08/11 0350)  Labs: Recent Labs    12/19/21 2034 12/19/21 2040 12/19/21 2112 12/19/21 2115 12/19/21 2209 12/20/21 0227 12/20/21 0536 12/20/21 0651 12/20/21 1004 12/20/21 1953 12/21/21 0604 12/22/21 0335  HGB 10.8* 10.9*  --   --    < > 10.5*  --  10.2*  --   --  10.0* 10.1*  HCT 32.8* 32.0*  --   --    < > 31.7*  --  30.0*  --   --  29.8* 29.7*  PLT 366  --   --   --   --  336  --   --   --   --  252 256  APTT 23*  --   --   --   --   --   --   --   --   --   --   --   LABPROT 14.9  --   --   --   --   --   --   --   --   --   --   --   INR 1.2  --   --   --   --   --   --   --   --   --   --   --   HEPARINUNFRC  --   --   --   --   --   --   --   --    < > 0.25* 0.24* 0.16*  CREATININE 4.73* 5.10*  --   --   --  4.58*  --   --   --   --  4.61*  --   CKTOTAL  --   --   --  275  --   --   --   --   --   --   --   --   TROPONINIHS  --   --  974*  --   --  1,869* 4,101*  --   --   --   --   --    < > = values in this interval not displayed.     Estimated Creatinine Clearance: 17.2 mL/min (A) (by C-G formula based on SCr of 4.61 mg/dL (H)).  Assessment: 66 y.o. male with continues on IV heparin for ACS.   Heparin level subtherapeutic (0.24) on infusion at 1700 units/hr. No issues with line or bleeding, cbc stable.  8/11 AM update:  Heparin level low Hgb stable  Goal of Therapy:  Heparin level 0.3-0.7 units/ml Monitor platelets by  anticoagulation protocol: Yes   Plan: Increase heparin infusion to 1950 units/hr 1200 heparin level   Narda Bonds, PharmD, [place] Pharmacist Phone: [phone]

## 2021-12-22 NOTE — Consult Note (Addendum)
Advanced Heart Failure Team Consult Note   Primary Physician: Pcp, No PCP-Cardiologist:  None  Reason for Consultation: Acute diastolic CHF with RV failure  HPI:    Joel Hart is seen today for evaluation of acute diastolic CHF with RV failure at the request of Dr. Gwenlyn Found with [facility]. 66 y.o. male with history of untreated OSA, HTN, trigeminal and occipital neuralgia, chronic pain s/p pain stimulator and on multiple pain medications (morphine, klonopin, gabapentin). Recently treated for infection/abscess of neck with Augmentin and doxycycline.  Apparently had AMS and myoclonic jerking for about 1-2 days. EMS called 12/19/21 after family heard patient fall in his room. He was found unresponsive. Code stroke activated. CT head negative. AMS felt to be d/t neurontin in setting of AKI. Labs remarkable for Scr 5.1, BUN 70, K 6.2, WBC 14.2, Hgb 10.8, BNP 4235>3614, HS troponin 816 615 1496.  CXR with evidence of CHF. Renal US okay. Hyperkalemia corrected.   He was admitted to internal medicine for acute respiratory failure with hypoxia initially requiring BiPAP, acute renal failure, new CHF and NSTEMI.  Started on IV lasix for diuresis and heparin gtt. Cardiology consulted. Subtle ST changes on ECG. Med management of NSTEMI recommended given renal failure. Elevated troponin thought to be d/t demand ischemia.  Coarse further c/b PNA and signs of ongoing neck infection. Initially treated with vanc for neck abscess but switched to azithromycin and rocephin to better cover PNA.  Nephrology following for AKI. Scr trending down. Diuresed well.  This afternoon developed hypotension with BP down to 70s/30s. Rapid called. 250 cc NS bolus ordered. Afternoon dose IV furosemide discontinued.  Echo today: EF 55-60%, akinesis left basal inferior wall, RV severely reduced  Review of Systems: [y] = yes, '[ ]'$  = no   General: Weight gain '[ ]'$ ; Weight loss [Y]; Anorexia '[ ]'$ ; Fatigue [Y ]; Fever '[ ]'$ ;  Chills '[ ]'$ ; Weakness '[ ]'$   Cardiac: Chest pain/pressure '[ ]'$ ; Resting SOB '[ ]'$ ; Exertional SOB '[ ]'$ ; Orthopnea '[ ]'$ ; Pedal Edema '[ ]'$ ; Palpitations '[ ]'$ ; Syncope '[ ]'$ ; Presyncope '[ ]'$ ; Paroxysmal nocturnal dyspnea'[ ]'$   Pulmonary: Cough '[ ]'$ ; Wheezing'[ ]'$ ; Hemoptysis'[ ]'$ ; Sputum '[ ]'$ ; Snoring '[ ]'$   GI: Vomiting'[ ]'$ ; Dysphagia'[ ]'$ ; Melena'[ ]'$ ; Hematochezia '[ ]'$ ; Heartburn'[ ]'$ ; Abdominal pain '[ ]'$ ; Constipation '[ ]'$ ; Diarrhea '[ ]'$ ; BRBPR '[ ]'$   GU: Hematuria'[ ]'$ ; Dysuria '[ ]'$ ; Nocturia'[ ]'$   Vascular: Pain in legs with walking '[ ]'$ ; Pain in feet with lying flat '[ ]'$ ; Non-healing sores '[ ]'$ ; Stroke '[ ]'$ ; TIA '[ ]'$ ; Slurred speech '[ ]'$ ;  Neuro: Headaches'[ ]'$ ; Vertigo'[ ]'$ ; Seizures'[ ]'$ ; Paresthesias'[ ]'$ ;Blurred vision '[ ]'$ ; Diplopia '[ ]'$ ; Vision changes '[ ]'$   Ortho/Skin: Arthritis '[ ]'$ ; Joint pain '[ ]'$ ; Muscle pain '[ ]'$ ; Joint swelling '[ ]'$ ; Back Pain '[ ]'$ ; Rash '[ ]'$   Psych: Depression'[ ]'$ ; Anxiety'[ ]'$   Heme: Bleeding problems '[ ]'$ ; Clotting disorders '[ ]'$ ; Anemia [Y]  Endocrine: Diabetes '[ ]'$ ; Thyroid dysfunction'[ ]'$   Home Medications Prior to Admission medications   Medication Sig Start Date End Date Taking? Authorizing Provider  amLODipine (NORVASC) 10 MG tablet Take 10 mg by mouth daily. 12/05/21  Yes [provider]  clonazePAM (KLONOPIN) 2 MG tablet Take 2 mg by mouth 2 (two) times daily as needed for anxiety. 10/30/17  Yes [provider]  cloNIDine (CATAPRES) 0.2 MG tablet Take 1 tablet by mouth at bedtime. 01/28/17  Yes [provider]  Cyanocobalamin (VITAMIN B-12 PO) Take  1 tablet by mouth daily.   Yes [provider]  gabapentin (NEURONTIN) 800 MG tablet Take 1 tablet by mouth 4 (four) times daily. 02/03/19  Yes [provider]  lisinopril (ZESTRIL) 20 MG tablet Take 20 mg by mouth daily. 10/19/21  Yes [provider]  morphine (MS CONTIN) 30 MG 12 hr tablet Take 1 tablet by mouth 3 (three) times daily. 02/06/19  Yes [provider]  PARoxetine (PAXIL) 30 MG tablet Take 30 mg by mouth  daily. 02/08/19  Yes [provider]  tiZANidine (ZANAFLEX) 4 MG tablet Take 4 mg by mouth every 12 (twelve) hours as needed. 02/13/19  Yes [provider]    Past Medical History: Past Medical History:  Diagnosis Date   Anxiety    Arthritis    Cancer ([place])    testicular   Depression    GERD (gastroesophageal reflux disease)    High triglycerides 12/19/2021   Hyperlipidemia    Hypertension    Sleep apnea    uses CPAP   Trigeminus neuralgia     Past Surgical History: Past Surgical History:  Procedure Laterality Date   BACK SURGERY     BRAIN SURGERY     times 2   CERVICAL FUSION     COLON SURGERY     from parasite    SPINAL CORD STIMULATOR INSERTION N/A 10/13/2013   Procedure: Supraorbital Peripheral nerve stimulator with Dr. Vertell Limber assisting;  Surgeon: Bonna Gains, MD;  Location: [facility] NEURO ORS;  Service: Neurosurgery;  Laterality: N/A;   SURGERY SCROTAL / TESTICULAR      Family History: Family History  Problem Relation Age of Onset   Hypertension Father     Social History: Social History   Socioeconomic History   Marital status: Married    Spouse name: Not on file   Number of children: Not on file   Years of education: Not on file   Highest education level: Not on file  Occupational History   Not on file  Tobacco Use   Smoking status: Never   Smokeless tobacco: Never  Substance and Sexual Activity   Alcohol use: Yes    Comment: one drink daily   Drug use: Yes    Types: Marijuana    Comment: uses daily   Sexual activity: Not on file  Other Topics Concern   Not on file  Social History Narrative   Left handed   2 story home   Social Determinants of Health   Financial Resource Strain: Not on file  Food Insecurity: Not on file  Transportation Needs: Not on file  Physical Activity: Not on file  Stress: Not on file  Social Connections: Not on file    Allergies:  Allergies  Allergen Reactions   Suboxone  [Buprenorphine Hcl-Naloxone  Hcl] Other (See Comments) and Shortness Of Breath   Mirtazapine Other (See Comments)    Other reaction(s): Other    Objective:    Vital Signs:   Temp:  [97.8 F (36.6 C)-100 F (37.8 C)] 97.8 F (36.6 C) (08/11 1416) Pulse Rate:  [36-98] 36 (08/11 1500) Resp:  [12-20] 16 (08/11 1500) BP: (63-145)/(38-66) 89/51 (08/11 1500) SpO2:  [72 %-100 %] 94 % (08/11 1500)    Weight change: Filed Weights   12/19/21 2014 12/20/21 1126  Weight: 98.8 kg 98.8 kg    Intake/Output:   Intake/Output Summary (Last 24 hours) at 12/22/2021 1527 Last data filed at 12/22/2021 1153 Gross per 24 hour  Intake 967.63 ml  Output 4850 ml  Net -3882.37 ml      Physical Exam    General:  Well appearing. No resp difficulty HEENT: normal Neck: supple. JVP . Carotids 2+ bilat; no bruits. No lymphadenopathy or thyromegaly appreciated. Cor: PMI nondisplaced. Regular rate & rhythm. No rubs, gallops or murmurs. Lungs: clear Abdomen: soft, nontender, nondistended. No hepatosplenomegaly. No bruits or masses. Good bowel sounds. Extremities: no cyanosis, clubbing, rash, edema Neuro: alert & orientedx3, cranial nerves grossly intact. moves all 4 extremities w/o difficulty. Affect pleasant   Telemetry   Sinus brady 50s this afternoon  EKG    Sinus 78 bpm, NSSTTWC  Labs   Basic Metabolic Panel: Recent Labs  Lab 12/19/21 2034 12/19/21 2040 12/19/21 2112 12/19/21 2209 12/20/21 0227 12/20/21 0651 12/21/21 0604 12/22/21 0335  NA 133* 131*  --  131* 137 133* 136 137  K 6.4* 6.2*   < > 6.1* 4.6 4.7 4.1 3.7  CL 100 101  --   --  100  --  100 97*  CO2 20*  --   --   --  24  --  25 28  GLUCOSE 164* 156*  --   --  128*  --  129* 116*  BUN 59* 70*  --   --  57*  --  66* 61*  CREATININE 4.73* 5.10*  --   --  4.58*  --  4.61* 4.07*  CALCIUM 8.0*  --   --   --  8.2*  --  8.1* 8.3*   < > = values in this interval not displayed.    Liver Function Tests: Recent Labs  Lab 12/19/21 2034 12/22/21 0335   AST 39 34  ALT 24 26  ALKPHOS 70 62  BILITOT 0.5 0.5  PROT 6.4* 6.0*  ALBUMIN 3.5 2.9*   No results for input(s): "LIPASE", "AMYLASE" in the last 168 hours. Recent Labs  Lab 12/19/21 2039  AMMONIA 25    CBC: Recent Labs  Lab 12/19/21 2034 12/19/21 2040 12/19/21 2209 12/20/21 0227 12/20/21 0651 12/21/21 0604 12/22/21 0335  WBC 14.2*  --   --  15.7*  --  14.5* 12.1*  NEUTROABS 12.9*  --   --   --   --   --   --   HGB 10.8*   < > 11.6* 10.5* 10.2* 10.0* 10.1*  HCT 32.8*   < > 34.0* 31.7* 30.0* 29.8* 29.7*  MCV 98.5  --   --  96.9  --  96.1 93.4  PLT 366  --   --  336  --  252 256   < > = values in this interval not displayed.    Cardiac Enzymes: Recent Labs  Lab 12/19/21 2115  CKTOTAL 275    BNP: BNP (last 3 results) Recent Labs    12/19/21 2034 12/20/21 0536  BNP 1,080.8* 1,543.9*    ProBNP (last 3 results) No results for input(s): "PROBNP" in the last 8760 hours.   CBG: Recent Labs  Lab 12/19/21 2007 12/19/21 2135 12/22/21 1416  GLUCAP 149* 135* 162*    Coagulation Studies: Recent Labs    12/19/21 2034  LABPROT 14.9  INR 1.2     Imaging   ECHOCARDIOGRAM COMPLETE  Result Date: 12/22/2021    ECHOCARDIOGRAM REPORT   Patient Name:   Joel Hart Date of Exam: 12/22/2021 Medical Rec #:  [id number]     Height:       67.0 in Accession #:    [id number]  Weight:       217.8 lb Date of Birth:  May 12, 1956     BSA:          2.097 m Patient Age:    18 years      BP:           126/57 mmHg Patient Gender: M             HR:           85 bpm. Exam Location:  Inpatient Procedure: 2D Echo Indications:    NSTEMI  History:        Patient has no prior history of Echocardiogram examinations.                 Signs/Symptoms:Altered Mental Status; Risk Factors:Hypertension.  Sonographer:    Johny Chess RDCS Referring Phys: [place]  Sonographer Comments: Suboptimal subcostal window. IMPRESSIONS  1. Left ventricular ejection fraction, by estimation,  is 55 to 60%. The left ventricle has normal function. The left ventricle demonstrates regional wall motion abnormalities (see scoring diagram/findings for description). Left ventricular diastolic parameters are indeterminate. There is akinesis of the left ventricular, basal inferior wall.  2. Right ventricular systolic function is severely reduced. The right ventricular size is moderately enlarged. Tricuspid regurgitation signal is inadequate for assessing PA pressure.  3. The mitral valve is normal in structure. No evidence of mitral valve regurgitation. No evidence of mitral stenosis.  4. The aortic valve is tricuspid. Aortic valve regurgitation is trivial. Aortic valve sclerosis/calcification is present, without any evidence of aortic stenosis. FINDINGS  Left Ventricle: Left ventricular ejection fraction, by estimation, is 55 to 60%. The left ventricle has normal function. The left ventricle demonstrates regional wall motion abnormalities. The left ventricular internal cavity size was normal in size. There is no left ventricular hypertrophy. Left ventricular diastolic parameters are indeterminate. Normal left ventricular filling pressure. Right Ventricle: The right ventricular size is moderately enlarged. No increase in right ventricular wall thickness. Right ventricular systolic function is severely reduced. Tricuspid regurgitation signal is inadequate for assessing PA pressure. Left [facility]: Left atrial size was normal in size. Right [facility]: Right atrial size was normal in size. Pericardium: There is no evidence of pericardial effusion. Mitral Valve: The mitral valve is normal in structure. No evidence of mitral valve regurgitation. No evidence of mitral valve stenosis. Tricuspid Valve: The tricuspid valve is normal in structure. Tricuspid valve regurgitation is trivial. No evidence of tricuspid stenosis. Aortic Valve: The aortic valve is tricuspid. Aortic valve regurgitation is trivial. Aortic valve  sclerosis/calcification is present, without any evidence of aortic stenosis. Pulmonic Valve: The pulmonic valve was normal in structure. Pulmonic valve regurgitation is trivial. No evidence of pulmonic stenosis. Aorta: The aortic root is normal in size and structure. Venous: The inferior vena cava was not well visualized. IAS/Shunts: No atrial level shunt detected by color flow Doppler.  LEFT VENTRICLE PLAX 2D LVIDd:         4.70 cm   Diastology LVIDs:         3.40 cm   LV e' medial:    5.98 cm/s LV PW:         1.10 cm   LV E/e' medial:  13.1 LV IVS:        1.00 cm   LV e' lateral:   9.14 cm/s LVOT diam:     2.40 cm   LV E/e' lateral: 8.5 LV SV:         67 LV  SV Index:   32 LVOT Area:     4.52 cm  RIGHT VENTRICLE RV S prime:     10.80 cm/s TAPSE (M-mode): 1.1 cm LEFT ATRIUM             Index        RIGHT ATRIUM           Index LA diam:        4.10 cm 1.96 cm/m   RA Area:     11.90 cm LA Vol (A2C):   62.8 ml 29.95 ml/m  RA Volume:   24.80 ml  11.83 ml/m LA Vol (A4C):   42.8 ml 20.41 ml/m LA Biplane Vol: 52.3 ml 24.94 ml/m  AORTIC VALVE LVOT Vmax:   79.50 cm/s LVOT Vmean:  54.800 cm/s LVOT VTI:    0.147 m  AORTA Ao Root diam: 3.10 cm Ao Asc diam:  2.90 cm MITRAL VALVE MV Area (PHT): 3.48 cm    SHUNTS MV Decel Time: 218 msec    Systemic VTI:  0.15 m MV E velocity: 78.10 cm/s  Systemic Diam: 2.40 cm MV A velocity: 71.80 cm/s MV E/A ratio:  1.09 Fransico Him MD Electronically signed by Fransico Him MD Signature Date/Time: 12/22/2021/11:02:46 AM    Final      Medications:     Current Medications:  aspirin EC  81 mg Oral Daily   Chlorhexidine Gluconate Cloth  6 each Topical Daily   vitamin B-12  1,000 mcg Oral Daily   folic acid  1 mg Oral Daily   [START ON 12/23/2021] furosemide  40 mg Intravenous Daily   gabapentin  300 mg Oral TID   heparin injection (subcutaneous)  5,000 Units Subcutaneous Q8H   PARoxetine  30 mg Oral Daily   rosuvastatin  10 mg Oral Daily   senna  1 tablet Oral BID   sodium  chloride flush  3 mL Intravenous Once    Infusions:  azithromycin Stopped (12/21/21 2005)   cefTRIAXone (ROCEPHIN)  IV Stopped (12/21/21 1827)      Patient Profile   66 y.o. male with history of trigeminal and occipital neuralgia, chronic pain s/p pain stimulator w/ recent neck abscess. Admitted with AMS, acute respiratory failure with hypoxia in setting of acute CHF and AKI, NSTEMI.   Assessment/Plan   Acute diastolic CHF with RV failure: -New diagnosis this admit -Echo on Dr. Claris Gladden review: EF 55-60%, RV moderately dilated with moderately reduced function, WMA in basal inferior wall does not appear as severe as reported, inadequate TR signal to estimate PA pressure -Nephrology has been dosing IV lasix with AKI. Has been diuresing well. No weights X 2 days. - SBP soft today. Volume status improved. Would hold further diuresis for now. Can add midodrine if needed - Etiology of RV dysfunction not certain. Reports he's had OSA for > 10 years, no longer using CPAP. Will likely need an updated sleep study. Need to r/o PE. Check V/Q scan. Anti DS DNA, ANCA, and ANA titers negative. - RHC Monday 08/14.  2. NSTEMI: - HS troponin 1,000>1,869>4,101 - Currently managed medically d/t AKI. Thought to be at least in part d/t demand ischemia. - Now off heparin gtt. Continue aspirin and statin. Will need to add plavix prior to discharge if manage medically.  3. AKI: - Etiology uncertain. ? Episodes of hypotension d/t pain meds. - Scr up to 5.1 on admit >> 4.07 today (baseline around 1) - Nephrology following. Ruling out GN.  4. Acute metabolic encephalopathy: -  CT head negative - AKI and pain meds may have been contributing  5. CAP: - Fever 08/09, leukocytosis and infiltrates on CXR - Treated with azithromycin and rocephin  6. Chronic pain syndrome s/p pain stimulator with abscess - Has been on multiple courses of abx  7. Anemia - hgb 10s - iron stores low.  - consider feraheme  once infection resolved  8. HTN - BP now soft - Holding all BP meds  Length of Stay: 3  FINCH, LINDSAY N, PA-C  12/22/2021, 3:27 PM  Advanced Heart Failure Team Pager [phone] (M-F; 7a - 5p)  Please contact [facility] for night-coverage after hours (4p -7a ) and weekends on [email or web address]   Patient seen with PA, agree with the above note.   Patient has history of chronic back pain with pain stimulator, neck abscess, OSA but not using CPAP.  He was admitted with slurred speech, altered mental status. Noted to have NSTEMI with HS-TnI up to 4101.  Creatinine 5 at admission, up from baseline around 1.  CXR with pulmonary edema, ?aspiration PNA.  He is on ceftriaxone azithromycin.  He was diuresed with IV Lasix initially, but SBP dropped to as low as 70s today and Lasix was stopped.  SBP now 100s.    HF service asked to see after echo today showed RV dysfunction.  I reviewed his echo personally.  EF 55-60%, RV with moderate dilation and mild to moderately decreased systolic function, no PA pressure estimation, IVC not visualized.   General: NAD Neck: No JVD, no thyromegaly or thyroid nodule.  Lungs: Clear to auscultation bilaterally with normal respiratory effort. CV: Nondisplaced PMI.  Heart regular S1/S2, no S3/S4, no murmur.  No peripheral edema.  No carotid bruit.  Normal pedal pulses.  Abdomen: Soft, nontender, no hepatosplenomegaly, no distention.  Skin: Intact without lesions or rashes.  Neurologic: Alert and oriented x 3.  Psych: Normal affect. Extremities: No clubbing or cyanosis.  HEENT: Normal.   1.  Acute diastolic CHF with prominent RV failure: Patient initially thought to be volume overloaded and was diuresed.  He had a vigorous diuresis.  Creatinine today is 4.07, down from 5.01 at admission.  Echo today on my review showed EF 55-60%, RV with moderate dilation and mild to moderately decreased systolic function, no PA pressure estimation, IVC not visualized. Cause of RV  dysfunction uncertain, could be due to untreated OSA (?OHS component).  Have not ruled out chronic PE.  He denies dyspnea. On exam, he does not look significantly volume overloaded. - I would hold Lasix for now.  - I think RHC would be helpful, will arrange for this on Monday morning.  Discussed risks/benefits with patient and he agrees to procedure.  - I will arrange for V/Q scan to rule out chronic or acute PE as cause of RV dysfunction (creatinine too high for CTA).  - He needs to get back on CPAP for OSA.  2. NSTEMI: Hs-TnI up to 4101 at admission.  No chest pain.  I am not impressed by wall motion abnormalities on his echo and ECG has no Qs.  I think RV infarct is unlikely.  It is possible that this was demand ischemia with profound hypotension prior to admission (possibly due to overmedication) leading to elevated troponin and AKI.  - on [place] heparin.  - Continue ASA and statin.  - Not candidate for coronary angiography with AKI and no chest pain.  3. AKI: Baseline creatinine around 1, admitted with creatinine 5.  ?Due  to hypotension with over-medication.  Initially had altered mental status thought to be due to gabapentin.  Creatinine slowly trending down.  - Would hold Lasix for now as he does not look volume overloaded.  4. Acute metabolic encephalopathy: Improved.  ?Medication-related (pain meds at home).  5. PNA: Possible aspiration PNA.  - Azithromycin/ceftriaxone.   Loralie Champagne 12/22/2021 5:05 PM

## 2021-12-22 NOTE — Progress Notes (Signed)
Progress Note   Patient: Joel Hart GGE:[id number] DOB: [birth year] DOA: 12/19/2021     3 DOS: the patient was seen and examined on 12/22/2021   Brief hospital course: 66yo with a long history of severe pain syndrome from trigeminal and occiptal neuralgia, followed by Dr. Maryruth Eve for pain management. He has recently been treated for a wound infection/abscess posterior neck with a round of Augmentin then a round of doxycycline. He has been having altered mental status and myclonic jerking for the last 24-48 hrs. Earlier on the day of admission he fell out of bed. Per his wife he was unarousable. For this reason EMS activated and patient brought to [facility] for evaluation as code stroke.     ED Course: Afebrile, 105/61 HR 74  RR 14. Patient appeared confused. Code Stroke called: CT head negative, neuro consult by Dr. Rory Percy - ruled out stroke. Lab revealed hyperkalemia at 6.2, acute renal failure with BUN 70 Cr 5.1. WBC elevated at 14.2 with 91/4/5. CT c-Spine negative but biapical ASD c/w edema, vs infection noted. CXR with central vascualr and perihilar ASD c/w CHF. Renal U/S negative. TRH called to admit for continue evaluation of new onset CHF, acute renal failure and to direct treatment.   Assessment and Plan: * Acute renal failure [facility]) Per old [facility] records patient carries dx of renal insufficiency since 2015 with a creatinine at that time of 1.3.  -Presenting with Cr of 5.1 -Pt was initially given gentle hydration for possible intravascular depletion. -Later with concerns of vol overload, continued on IV lasix -Indwelling cath placed with very good urine output -Appreciate input by Nephrology. Recs to start acute GN work up.  -repeat CXR appeared worse -Cr down to 4.07 today   Pulmonary edema Patient denies any cardiac history. Denies chest pain and denies acute SOB. Wife reports he has had a cough and is sedentary. CXR and CT reveal Air space disease strongly suggestive of pulmonary edema.  BNP elevated to 1,080. EKG w/o acute changes.  -Patient presented with hypoxemia requiring 100% rebreather facemask and later BiPap -Given course of IV lasix with good output, now weaned to [facility] - 2d echo ordered by Cardiology, reviewed. Findings notable for akinesis of LV basal inferior wall and severely reduced RV function -Hypotensive this afternoon, receiving 500cc IV bolus. Heart Failure team now following  Pneumonia -Pt with recent temp of 100.30F on 8/9 with worsening infiltrates on CXR and leukocytosis -UA clear, blood cx pending -Have transitioned abx to azithro plus rocephin to better cover PNA  NSTEMI -Trop peaked to the 4000 range -Continued on heparin gtt -Per Cards, would ultimately benefit from RHC once renal function improves   Acute hyperkalemia Patient with hyperkalemia to 6.2 w/o EKG changes at presentation -resolved   Cellulitis and abscess of neck Patient with infection/abscess posterior neck at surgical site. He has had a course of augmentin and a course of doxycycline. -was given course of vanc initially -This AM, area is not erythematous, indurated, or draining   Chronic pain syndrome Patient with trigeminal and occipatl neuralgia followed by Dr. Maryruth Eve. Has had implantable stimulators including left forehead and posterior neck. -In setting of renal failure, avoid morphine -Recs for hydromorphone, fentanyl, and methadone for pain control -Gabapentin renally dosed   Depression with anxiety Long standing problem of depression and anxiety related to chronic pain syndrome - Continue Klonopin and SSRI   HTN (hypertension) -BP is controlled. -Continue amolodipine -Cont to hold Zestril in light of acute renal failure  Subjective: States feeling better. Is feeling frustrated that things are not being explained to him adequately, causing him to feel agitated  Physical Exam: Vitals:   12/22/21 1429 12/22/21 1445 12/22/21 1500 12/22/21 1607  BP: (!) 83/44  (!) 87/47 (!) 89/51 (!) 104/47  Pulse: (!) 55 (!) 53 (!) 36   Resp: '15 15 16 16  '$ Temp:      TempSrc:      SpO2: 98% 90% 94%   Weight:      Height:       General exam: Awake, laying in bed, in nad Respiratory system: Normal respiratory effort, no wheezing Cardiovascular system: regular rate, s1, s2 Gastrointestinal system: Soft, nondistended, positive BS Central nervous system: CN2-12 grossly intact, strength intact Extremities: Perfused, no clubbing Skin: Normal skin turgor, no notable skin lesions seen Psychiatry: Mood normal // no visual hallucinations   Data Reviewed:  Labs reviewed: Na 137, K 3.7, Cr 4.07   Family Communication: Pt in room, family not at bedside  Disposition: Status is: Inpatient Remains inpatient appropriate because: Severity of illness  Planned Discharge Destination: Home    Author: Marylu Lund, MD 12/22/2021 4:28 PM  For on call review [email or web address].

## 2021-12-22 NOTE — Care Management Important Message (Signed)
Important Message  Patient Details  Name: Joel Hart MRN: [id number] Date of Birth: [birth year]   Medicare Important Message Given:  Yes     Shelda Altes 12/22/2021, 8:27 AM

## 2021-12-22 NOTE — Progress Notes (Signed)
Patient ID: Joel Hart, male   DOB: [birth year], 66 y.o.   MRN: [id number] S: more clear headed today, no complaints. O:BP (!) 78/38 (BP Location: Right Arm)   Pulse 64   Temp 97.7 F (36.5 C) (Oral)   Resp 12   Ht '5\' 7"'$  (1.702 m)   Wt 98.8 kg   SpO2 92%   BMI 34.11 kg/m   Intake/Output Summary (Last 24 hours) at 12/22/2021 1219 Last data filed at 12/22/2021 1153 Gross per 24 hour  Intake 967.63 ml  Output 5700 ml  Net -4732.37 ml   Intake/Output: I/O last 3 completed shifts: In: 1521.6 [P.O.:440; I.V.:731.7; IV Piggyback:349.9] Out: 6350 [Urine:6350]  Intake/Output this shift:  Total I/O In: -  Out: 1000 [Urine:1000] Weight change:  Gen: NAD CVS: RRR Resp: CTA Abd: +BS, soft, NT/ND Ext: no edema  Recent Labs  Lab 12/19/21 2034 12/19/21 2040 12/19/21 2112 12/19/21 2209 12/20/21 0227 12/20/21 0651 12/21/21 0604 12/22/21 0335  NA 133* 131*  --  131* 137 133* 136 137  K 6.4* 6.2* 4.9 6.1* 4.6 4.7 4.1 3.7  CL 100 101  --   --  100  --  100 97*  CO2 20*  --   --   --  24  --  25 28  GLUCOSE 164* 156*  --   --  128*  --  129* 116*  BUN 59* 70*  --   --  57*  --  66* 61*  CREATININE 4.73* 5.10*  --   --  4.58*  --  4.61* 4.07*  ALBUMIN 3.5  --   --   --   --   --   --  2.9*  CALCIUM 8.0*  --   --   --  8.2*  --  8.1* 8.3*  AST 39  --   --   --   --   --   --  34  ALT 24  --   --   --   --   --   --  26   Liver Function Tests: Recent Labs  Lab 12/19/21 2034 12/22/21 0335  AST 39 34  ALT 24 26  ALKPHOS 70 62  BILITOT 0.5 0.5  PROT 6.4* 6.0*  ALBUMIN 3.5 2.9*   No results for input(s): "LIPASE", "AMYLASE" in the last 168 hours. Recent Labs  Lab 12/19/21 2039  AMMONIA 25   CBC: Recent Labs  Lab 12/19/21 2034 12/19/21 2040 12/20/21 0227 12/20/21 0651 12/21/21 0604 12/22/21 0335  WBC 14.2*  --  15.7*  --  14.5* 12.1*  NEUTROABS 12.9*  --   --   --   --   --   HGB 10.8*   < > 10.5* 10.2* 10.0* 10.1*  HCT 32.8*   < > 31.7* 30.0* 29.8* 29.7*  MCV  98.5  --  96.9  --  96.1 93.4  PLT 366  --  336  --  252 256   < > = values in this interval not displayed.   Cardiac Enzymes: Recent Labs  Lab 12/19/21 2115  CKTOTAL 275   CBG: Recent Labs  Lab 12/19/21 2007 12/19/21 2135  GLUCAP 149* 135*    Iron Studies:  Recent Labs    12/21/21 0604  IRON 13*  TIBC 293  FERRITIN 237   Studies/Results: ECHOCARDIOGRAM COMPLETE  Result Date: 12/22/2021    ECHOCARDIOGRAM REPORT   Patient Name:   Joel Hart Date of Exam: 12/22/2021 Medical Rec #:  [id number]  Height:       67.0 in Accession #:    [id number]    Weight:       217.8 lb Date of Birth:  [birth year]     BSA:          2.097 m Patient Age:    66 years      BP:           126/57 mmHg Patient Gender: M             HR:           85 bpm. Exam Location:  Inpatient Procedure: 2D Echo Indications:    NSTEMI  History:        Patient has no prior history of Echocardiogram examinations.                 Signs/Symptoms:Altered Mental Status; Risk Factors:Hypertension.  Sonographer:    Johny Chess RDCS Referring Phys: [place]  Sonographer Comments: Suboptimal subcostal window. IMPRESSIONS  1. Left ventricular ejection fraction, by estimation, is 55 to 60%. The left ventricle has normal function. The left ventricle demonstrates regional wall motion abnormalities (see scoring diagram/findings for description). Left ventricular diastolic parameters are indeterminate. There is akinesis of the left ventricular, basal inferior wall.  2. Right ventricular systolic function is severely reduced. The right ventricular size is moderately enlarged. Tricuspid regurgitation signal is inadequate for assessing PA pressure.  3. The mitral valve is normal in structure. No evidence of mitral valve regurgitation. No evidence of mitral stenosis.  4. The aortic valve is tricuspid. Aortic valve regurgitation is trivial. Aortic valve sclerosis/calcification is present, without any evidence of aortic stenosis.  FINDINGS  Left Ventricle: Left ventricular ejection fraction, by estimation, is 55 to 60%. The left ventricle has normal function. The left ventricle demonstrates regional wall motion abnormalities. The left ventricular internal cavity size was normal in size. There is no left ventricular hypertrophy. Left ventricular diastolic parameters are indeterminate. Normal left ventricular filling pressure. Right Ventricle: The right ventricular size is moderately enlarged. No increase in right ventricular wall thickness. Right ventricular systolic function is severely reduced. Tricuspid regurgitation signal is inadequate for assessing PA pressure. Left Atrium: Left atrial size was normal in size. Right Atrium: Right atrial size was normal in size. Pericardium: There is no evidence of pericardial effusion. Mitral Valve: The mitral valve is normal in structure. No evidence of mitral valve regurgitation. No evidence of mitral valve stenosis. Tricuspid Valve: The tricuspid valve is normal in structure. Tricuspid valve regurgitation is trivial. No evidence of tricuspid stenosis. Aortic Valve: The aortic valve is tricuspid. Aortic valve regurgitation is trivial. Aortic valve sclerosis/calcification is present, without any evidence of aortic stenosis. Pulmonic Valve: The pulmonic valve was normal in structure. Pulmonic valve regurgitation is trivial. No evidence of pulmonic stenosis. Aorta: The aortic root is normal in size and structure. Venous: The inferior vena cava was not well visualized. IAS/Shunts: No atrial level shunt detected by color flow Doppler.  LEFT VENTRICLE PLAX 2D LVIDd:         4.70 cm   Diastology LVIDs:         3.40 cm   LV e' medial:    5.98 cm/s LV PW:         1.10 cm   LV E/e' medial:  13.1 LV IVS:        1.00 cm   LV e' lateral:   9.14 cm/s LVOT diam:     2.40 cm  LV E/e' lateral: 8.5 LV SV:         67 LV SV Index:   32 LVOT Area:     4.52 cm  RIGHT VENTRICLE RV S prime:     10.80 cm/s TAPSE (M-mode):  1.1 cm LEFT ATRIUM             Index        RIGHT ATRIUM           Index LA diam:        4.10 cm 1.96 cm/m   RA Area:     11.90 cm LA Vol (A2C):   62.8 ml 29.95 ml/m  RA Volume:   24.80 ml  11.83 ml/m LA Vol (A4C):   42.8 ml 20.41 ml/m LA Biplane Vol: 52.3 ml 24.94 ml/m  AORTIC VALVE LVOT Vmax:   79.50 cm/s LVOT Vmean:  54.800 cm/s LVOT VTI:    0.147 m  AORTA Ao Root diam: 3.10 cm Ao Asc diam:  2.90 cm MITRAL VALVE MV Area (PHT): 3.48 cm    SHUNTS MV Decel Time: 218 msec    Systemic VTI:  0.15 m MV E velocity: 78.10 cm/s  Systemic Diam: 2.40 cm MV A velocity: 71.80 cm/s MV E/A ratio:  1.09 Fransico Him MD Electronically signed by Fransico Him MD Signature Date/Time: 12/22/2021/11:02:46 AM    Final    DG Chest 2 View  Result Date: 12/21/2021 CLINICAL DATA:  Encounter for CHF EXAM: CHEST - 2 VIEW COMPARISON:  Two days ago FINDINGS: Worsening diffuse hazy opacification of the chest, compatible with history of CHF. Borderline cardiomegaly and vascular pedicle widening. No visible effusion or pneumothorax. Artifact from EKG leads an neural stimulators. IMPRESSION: Worsening airspace disease. Electronically Signed   By: Jorje Guild M.D.   On: 12/21/2021 05:37    aspirin EC  81 mg Oral Daily   Chlorhexidine Gluconate Cloth  6 each Topical Daily   vitamin B-12  1,000 mcg Oral Daily   folic acid  1 mg Oral Daily   furosemide  40 mg Intravenous BID   gabapentin  300 mg Oral TID   heparin injection (subcutaneous)  5,000 Units Subcutaneous Q8H   PARoxetine  30 mg Oral Daily   rosuvastatin  10 mg Oral Daily   senna  1 tablet Oral BID   sodium chloride flush  3 mL Intravenous Once    BMET    Component Value Date/Time   NA 137 12/22/2021 0335   NA 139 04/30/2013 1622   K 3.7 12/22/2021 0335   K 4.5 04/30/2013 1622   CL 97 (L) 12/22/2021 0335   CL 104 04/30/2013 1622   CO2 28 12/22/2021 0335   CO2 29 04/30/2013 1622   GLUCOSE 116 (H) 12/22/2021 0335   GLUCOSE 109 (H) 04/30/2013 1622   BUN 61  (H) 12/22/2021 0335   BUN 15 04/30/2013 1622   CREATININE 4.07 (H) 12/22/2021 0335   CREATININE 1.33 (H) 04/30/2013 1622   CALCIUM 8.3 (L) 12/22/2021 0335   CALCIUM 9.4 04/30/2013 1622   GFRNONAA 15 (L) 12/22/2021 0335   GFRNONAA 58 (L) 04/30/2013 1622   GFRAA 63 (L) 10/08/2013 1023   GFRAA >60 04/30/2013 1622   CBC    Component Value Date/Time   WBC 12.1 (H) 12/22/2021 0335   RBC 3.18 (L) 12/22/2021 0335   HGB 10.1 (L) 12/22/2021 0335   HCT 29.7 (L) 12/22/2021 0335   PLT 256 12/22/2021 0335   MCV 93.4 12/22/2021 0335  MCH 31.8 12/22/2021 0335   MCHC 34.0 12/22/2021 0335   RDW 12.9 12/22/2021 0335   LYMPHSABS 0.6 (L) 12/19/2021 2034   MONOABS 0.7 12/19/2021 2034   EOSABS 0.0 12/19/2021 2034   BASOSABS 0.0 12/19/2021 2034      Assessment/Plan:  AKI - unclear etiology.  Renal US without obstruction or abnormalities.  UA with moderate blood but following catheter placement.  UNa <10, FeNa 0.19% consistent with pre-renal causes and may be dehydrated due to AMS for the past week.  His elevated BNP and CXR support pulmonary edema.  Will start acute GN workup which can also have low FeNa.  Given his significant UOP, low FeNa, low BP, and improved respiratory status will hold further IV lasix for now and follow.  BUN/Cr already improving.  No indication for dialysis at this time.  Will continue to follow closely.  Avoid nephrotoxic medications including NSAIDs and iodinated intravenous contrast exposure unless the latter is absolutely indicated. Preferred narcotic agents for pain control are hydromorphone, fentanyl, and methadone. Morphine should not be used.  Avoid Baclofen and avoid oral sodium phosphate and magnesium citrate based laxatives / bowel preps.  Continue strict Input and Output monitoring.  Will monitor the patient closely with you and intervene or adjust therapy as indicated by changes in clinical status/labs  Acute metabolic encephalopathy - negative for CVA and may be  related to pain meds as well as AKI.  Improving.   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  Preserved EF.  NSTEMI - Cardiology following Pulmonary edema - mild, seen on CXR with rising BNP.  Would also recommend repeat imaging to r/o pneumonia give elevated WBC and AMS.  PNA would also increase BNP.  He has received 3 doses of IV lasix at 40 mg and produced almost 1500 mL today.  Lasix held for now as above given low FeNa and AKI.  No peripheral edema.  diuresed 2.6 liters overnight but CXR this morning is worsening diffuse hazy opacification.  I am concerned that this may actually be a pneumonia.  Awaiting ECHO, he refused yesterday.  Cardiology following.  I did decrease furosemide to 40 mg daily given vigorous diuresis and reduced RV function.  Hyperkalemia - improved with medical therapy and lasix. Hyponatremia - improving Anemia - new, will continue to follow.  Will order SPEP/UPEP and iron stores.    Donetta Potts, MD [facility]

## 2021-12-22 NOTE — Progress Notes (Addendum)
At 1214, BP 78/38. RN was not notified and did not see pressure until 1300. At 1309, BP was 65/44, cuff repositioned, and 1314 BP was 76/57. Pt denies symptoms and refusing to let staff reposition BP cuff. MD Wyline Copas and PA Mancel Bale notified.  Rapid called, 250cc NS bolus ordered. MD Wyline Copas at bedside. At 1344, BP 80/42.

## 2021-12-22 NOTE — Significant Event (Addendum)
Rapid Response Event Note   Reason for Call :  Hypotension   Initial Focused Assessment:  Pt lying in bed. Alert, intermittently confused. Lung sounds are clear. No adventitious heart sounds. Heart rate regular, bradycardic at 55 bpm. Skin is warm, dry, pink. Skin non-tenting. No JVD. No edema. He denies pain, chest pain, lightheadedness, or dizziness.  VS: T 97.17F, BP 80/50 (manual), HR 59, RR 12, SpO2 100% on 3LNC  Interventions:  -500 cc IVF bolus  Plan of Care:  -Close monitoring of VS and mentation -Strict I&O -HF team to see pt  Call rapid response for additional needs  Event Summary:  MD Notified: Dr. Wyline Copas Call Time: [id number] Arrival Time: 1400 End Time: [place], RN

## 2021-12-22 NOTE — Progress Notes (Addendum)
Pt given '2mg'$  clonazepam at 1757 but only took '1mg'$ . Pt educated each pill is 0.'5mg'$  so he would have to take 4 pills total. Pt stated he has not seen the pills so thick and does not want to overdose. RN attempted to explain the pill size, dosage, and number of pills again but pt stated he is going to take less tonight ('1mg'$ ) and "if he needs more, then that's on him".   Wasted '1mg'$  with Dina Rich, RN.

## 2021-12-22 NOTE — Progress Notes (Signed)
  Echocardiogram 2D Echocardiogram has been performed.  Joel Hart 12/22/2021, 10:54 AM

## 2021-12-22 NOTE — Progress Notes (Addendum)
Progress Note  Patient Name: Joel Hart Date of Encounter: 12/22/2021  [facility] Cardiologist: None   Subjective   Much improved this morning. Sitting up in the chair bathing. No complaints, other than he feels foggy about the events leading up to hospitalization.   Inpatient Medications    Scheduled Meds:  aspirin EC  81 mg Oral Daily   Chlorhexidine Gluconate Cloth  6 each Topical Daily   vitamin B-12  1,000 mcg Oral Daily   folic acid  1 mg Oral Daily   furosemide  40 mg Intravenous BID   gabapentin  300 mg Oral TID   PARoxetine  30 mg Oral Daily   rosuvastatin  10 mg Oral Daily   senna  1 tablet Oral BID   sodium chloride flush  3 mL Intravenous Once   Continuous Infusions:  azithromycin Stopped (12/21/21 2005)   cefTRIAXone (ROCEPHIN)  IV Stopped (12/21/21 1827)   heparin 1,950 Units/hr (12/22/21 0559)   PRN Meds: acetaminophen, clonazePAM, haloperidol lactate, LORazepam, morphine injection, ondansetron (ZOFRAN) IV, tiZANidine   Vital Signs    Vitals:   12/22/21 0015 12/22/21 0350 12/22/21 0602 12/22/21 0718  BP: 116/60 113/66  (!) 126/57  Pulse: 94 90  81  Resp: '18 18  12  '$ Temp: 98.3 F (36.8 C) 100 F (37.8 C) 98.5 F (36.9 C) 98.6 F (37 C)  TempSrc: Oral Oral Oral Oral  SpO2: 97% 98%  (!) 85%  Weight:      Height:        Intake/Output Summary (Last 24 hours) at 12/22/2021 0819 Last data filed at 12/22/2021 6546 Gross per 24 hour  Intake 967.63 ml  Output 4700 ml  Net -3732.37 ml      12/20/2021   11:26 AM 12/19/2021    8:14 PM 08/16/2021    2:24 PM  Last 3 Weights  Weight (lbs) 217 lb 13 oz 217 lb 13 oz 205 lb  Weight (kg) 98.8 kg 98.8 kg 92.987 kg      Telemetry    Sinus Rhythm - Personally Reviewed  ECG    No new tracing  Physical Exam   GEN: No acute distress.   Neck: No JVD Cardiac: RRR, no murmurs, rubs, or gallops.  Respiratory: Clear to auscultation bilaterally. GI: Soft, nontender, non-distended  MS: No edema; No  deformity. Neuro:  Nonfocal  Psych: Normal affect   Labs    High Sensitivity Troponin:   Recent Labs  Lab 12/19/21 2112 12/20/21 0227 12/20/21 0536  TROPONINIHS 974* 1,869* 4,101*     Chemistry Recent Labs  Lab 12/19/21 2034 12/19/21 2040 12/20/21 0227 12/20/21 0651 12/21/21 0604 12/22/21 0335  NA 133*   < > 137 133* 136 137  K 6.4*   < > 4.6 4.7 4.1 3.7  CL 100   < > 100  --  100 97*  CO2 20*  --  24  --  25 28  GLUCOSE 164*   < > 128*  --  129* 116*  BUN 59*   < > 57*  --  66* 61*  CREATININE 4.73*   < > 4.58*  --  4.61* 4.07*  CALCIUM 8.0*  --  8.2*  --  8.1* 8.3*  PROT 6.4*  --   --   --   --  6.0*  ALBUMIN 3.5  --   --   --   --  2.9*  AST 39  --   --   --   --  34  ALT 24  --   --   --   --  26  ALKPHOS 70  --   --   --   --  62  BILITOT 0.5  --   --   --   --  0.5  GFRNONAA 13*  --  13*  --  13* 15*  ANIONGAP 13  --  13  --  11 12   < > = values in this interval not displayed.    Lipids No results for input(s): "CHOL", "TRIG", "HDL", "LABVLDL", "LDLCALC", "CHOLHDL" in the last 168 hours.  Hematology Recent Labs  Lab 12/20/21 0227 12/20/21 0651 12/21/21 0604 12/22/21 0335  WBC 15.7*  --  14.5* 12.1*  RBC 3.27*  --  3.10* 3.18*  HGB 10.5* 10.2* 10.0* 10.1*  HCT 31.7* 30.0* 29.8* 29.7*  MCV 96.9  --  96.1 93.4  MCH 32.1  --  32.3 31.8  MCHC 33.1  --  33.6 34.0  RDW 12.9  --  13.1 12.9  PLT 336  --  252 256   Thyroid No results for input(s): "TSH", "FREET4" in the last 168 hours.  BNP Recent Labs  Lab 12/19/21 2034 12/20/21 0536  BNP 1,080.8* 1,543.9*    DDimer No results for input(s): "DDIMER" in the last 168 hours.   Radiology    DG Chest 2 View  Result Date: 12/21/2021 CLINICAL DATA:  Encounter for CHF EXAM: CHEST - 2 VIEW COMPARISON:  Two days ago FINDINGS: Worsening diffuse hazy opacification of the chest, compatible with history of CHF. Borderline cardiomegaly and vascular pedicle widening. No visible effusion or pneumothorax. Artifact  from EKG leads an neural stimulators. IMPRESSION: Worsening airspace disease. Electronically Signed   By: Jorje Guild M.D.   On: 12/21/2021 05:37    Cardiac Studies   Echo: pending  Patient Profile     66 y.o. male a hx of HTN, HLD, OSA on Cpap, chronic pain, recent wound infection/neck abscess and trigeminal/occipital neuralgia who was seen 12/20/2021 for the evaluation of NSTEMI at the request of Dr. Wyline Copas.  Assessment & Plan    NSTEMI Demand ischemia -- in the setting of acute renal failure, pulmonary edema and altered mental status.  He denies any chest pain prior to admission.  High-sensitivity troponin 974>> 1860>> 4101.  EKG with slight ST depression in anterior lateral leads.  Given his acute renal failure, he is not a candidate for cardiac catheterization at this time.  Suspect this may be some degree of demand ischemia from his acute illness.  Would continue to treat medically at this time, follow renal function --has been treated with IV heparin (total of 48hrs), aspirin and low dose statin therapy --Echo pending (initially refused but now agreeable)   Acute renal failure Hyperkalemia --Notes indicate prior creatinine around 1.3.  Noted at 5.1 this admission along with potassium of 6.2.  Cr 5.10>>4.58>>4.61>>4.00 --Nephrology following   Pulmonary edema Acute hypoxic respiratory -- Chest x-ray with pulmonary edema on admission with volume overload.  BNP 1543. -- Lasix reduced to '40mg'$  BID per nephrology. Net - 6.3L, will order daily weights -- Echo pending   Altered mental status: Initially called code stroke on arrival to the ED.  He was evaluated by neurology and felt this was more likely myoclonus or altered mental status in the setting of Neurontin use with AKI. Significantly improved, now alert and oriented. -- Per primary   Hypertension: History of the same, but blood pressures actually somewhat soft  on admission. Now improved   Per primary Cellulitis/abscess of  neck Chronic pain syndrome/trigeminal neuralgia Depression Anxiety  For questions or updates, please contact [facility] Please consult [email or web address] for contact info under        Signed, Reino Bellis, NP  12/22/2021, 8:19 AM    Agree with note by Reino Bellis NP-C  Clinically improved, alert and oriented.  Good diuresis (6.3 L).  2D echo pending.  At this point, I think his elevated troponins probably related to demand ischemia.  His serum creatinine is slowly improving.   Lorretta Harp, M.D., [place], [facility], [place] [address] [place], [place]  [id number]  [phone] 12/22/2021 11:11 AM

## 2021-12-23 DIAGNOSIS — L03221 Cellulitis of neck: Secondary | ICD-10-CM | POA: Diagnosis not present

## 2021-12-23 DIAGNOSIS — L0211 Cutaneous abscess of neck: Secondary | ICD-10-CM | POA: Diagnosis not present

## 2021-12-23 DIAGNOSIS — I249 Acute ischemic heart disease, unspecified: Secondary | ICD-10-CM | POA: Diagnosis not present

## 2021-12-23 DIAGNOSIS — N179 Acute kidney failure, unspecified: Secondary | ICD-10-CM | POA: Diagnosis not present

## 2021-12-23 LAB — BASIC METABOLIC PANEL
Anion gap: 12 (ref 5–15)
BUN: 64 mg/dL — ABNORMAL HIGH (ref 8–23)
CO2: 30 mmol/L (ref 22–32)
Calcium: 8.8 mg/dL — ABNORMAL LOW (ref 8.9–10.3)
Chloride: 96 mmol/L — ABNORMAL LOW (ref 98–111)
Creatinine, Ser: 3.67 mg/dL — ABNORMAL HIGH (ref 0.61–1.24)
GFR, Estimated: 17 mL/min — ABNORMAL LOW (ref >60)
Glucose, Bld: 103 mg/dL — ABNORMAL HIGH (ref 70–99)
Potassium: 4 mmol/L (ref 3.5–5.1)
Sodium: 138 mmol/L (ref 135–145)

## 2021-12-23 LAB — CBC
HCT: 30.3 % — ABNORMAL LOW (ref 39.0–52.0)
Hemoglobin: 10.5 g/dL — ABNORMAL LOW (ref 13.0–17.0)
MCH: 31.7 pg (ref 26.0–34.0)
MCHC: 34.7 g/dL (ref 30.0–36.0)
MCV: 91.5 fL (ref 80.0–100.0)
Platelets: 283 10*3/uL (ref 150–400)
RBC: 3.31 MIL/uL — ABNORMAL LOW (ref 4.22–5.81)
RDW: 12.7 % (ref 11.5–15.5)
WBC: 11.5 10*3/uL — ABNORMAL HIGH (ref 4.0–10.5)
nRBC: 0 % (ref 0.0–0.2)

## 2021-12-23 MED ORDER — POLYETHYLENE GLYCOL 3350 17 G PO PACK
17.0000 g | PACK | Freq: Every day | ORAL | Status: DC
  Administered 2021-12-23 – 2021-12-24 (×2): 17 g via ORAL
  Filled 2021-12-23 (×2): qty 1

## 2021-12-23 MED ORDER — AZITHROMYCIN 250 MG PO TABS
500.0000 mg | ORAL_TABLET | Freq: Every day | ORAL | Status: DC
  Administered 2021-12-23: 500 mg via ORAL
  Filled 2021-12-23: qty 2

## 2021-12-23 MED ORDER — HALOPERIDOL LACTATE 5 MG/ML IJ SOLN: 5.0000 mg | Freq: Four times a day (QID) | INTRAMUSCULAR | Status: DC | PRN

## 2021-12-23 NOTE — Plan of Care (Signed)
  Problem: Safety: Goal: Non-violent Restraint(s) Outcome: Progressing    Problem: Education: Goal: Knowledge of General Education information will improve Description: Including pain rating scale, medication(s)/side effects and non-pharmacologic comfort measures Outcome: Progressing   Problem: Health Behavior/Discharge Planning: Goal: Ability to manage health-related needs will improve Outcome: Progressing   Problem: Clinical Measurements: Goal: Ability to maintain clinical measurements within normal limits will improve Outcome: Progressing   Problem: Clinical Measurements: Goal: Respiratory complications will improve Outcome: Progressing   Problem: Clinical Measurements: Goal: Cardiovascular complication will be avoided Outcome: Progressing   Problem: Coping: Goal: Level of anxiety will decrease Outcome: Progressing   Problem: Pain Managment: Goal: General experience of comfort will improve Outcome: Progressing   Problem: Safety: Goal: Ability to remain free from injury will improve Outcome: Progressing   Problem: Skin Integrity: Goal: Risk for impaired skin integrity will decrease Outcome: Progressing

## 2021-12-23 NOTE — Progress Notes (Signed)
Progress Note  Patient Name: Joel Hart Date of Encounter: 12/23/2021  Primary Cardiologist: None   Subjective   Dyspnea about the same. No chest pain. BP better.  Inpatient Medications    Scheduled Meds:  aspirin EC  81 mg Oral Daily   Chlorhexidine Gluconate Cloth  6 each Topical Daily   vitamin B-12  1,000 mcg Oral Daily   folic acid  1 mg Oral Daily   gabapentin  300 mg Oral TID   heparin injection (subcutaneous)  5,000 Units Subcutaneous Q8H   PARoxetine  30 mg Oral Daily   rosuvastatin  10 mg Oral Daily   senna  1 tablet Oral BID   sodium chloride flush  3 mL Intravenous Once   sodium chloride flush  3 mL Intravenous Q12H   Continuous Infusions:  azithromycin 500 mg (12/22/21 1755)   cefTRIAXone (ROCEPHIN)  IV 2 g (12/22/21 1634)   PRN Meds: acetaminophen, clonazePAM, haloperidol lactate, LORazepam, morphine injection, ondansetron (ZOFRAN) IV, tiZANidine   Vital Signs    Vitals:   12/22/21 1746 12/22/21 1930 12/23/21 0535 12/23/21 0814  BP: (!) 94/42 (!) 100/55 (!) 110/58 130/63  Pulse: 71 67 77 81  Resp: '17 17 16 19  '$ Temp: 98 F (36.7 C) 98.3 F (36.8 C) 98.1 F (36.7 C) 98.4 F (36.9 C)  TempSrc: Oral Oral Oral Oral  SpO2: 100% 100% (!) 89% 94%  Weight:   88 kg   Height:        Intake/Output Summary (Last 24 hours) at 12/23/2021 0936 Last data filed at 12/23/2021 0500 Gross per 24 hour  Intake 797.21 ml  Output 2400 ml  Net -1602.79 ml   Filed Weights   12/19/21 2014 12/20/21 1126 12/23/21 0535  Weight: 98.8 kg 98.8 kg 88 kg    Telemetry    nsr - Personally Reviewed  ECG    none - Personally Reviewed  Physical Exam   GEN:  no acute distress.   Neck: No JVD Cardiac: RRR, no murmurs, rubs, or gallops.  Respiratory: Clear to auscultation bilaterally. GI: Soft, nontender, non-distended , obese MS: No edema; No deformity. Neuro:  Nonfocal  Psych: Normal affect   Labs    Chemistry Recent Labs  Lab 12/19/21 2034  12/19/21 2040 12/21/21 0604 12/22/21 0335 12/23/21 0544  NA 133*   < > 136 137 138  K 6.4*   < > 4.1 3.7 4.0  CL 100   < > 100 97* 96*  CO2 20*   < > '25 28 30  '$ GLUCOSE 164*   < > 129* 116* 103*  BUN 59*   < > 66* 61* 64*  CREATININE 4.73*   < > 4.61* 4.07* 3.67*  CALCIUM 8.0*   < > 8.1* 8.3* 8.8*  PROT 6.4*  --   --  6.0*  --   ALBUMIN 3.5  --   --  2.9*  --   AST 39  --   --  34  --   ALT 24  --   --  26  --   ALKPHOS 70  --   --  62  --   BILITOT 0.5  --   --  0.5  --   GFRNONAA 13*   < > 13* 15* 17*  ANIONGAP 13   < > '11 12 12   '$ < > = values in this interval not displayed.     Hematology Recent Labs  Lab 12/21/21 0604 12/22/21 0335 12/23/21 5329  WBC 14.5* 12.1* 11.5*  RBC 3.10* 3.18* 3.31*  HGB 10.0* 10.1* 10.5*  HCT 29.8* 29.7* 30.3*  MCV 96.1 93.4 91.5  MCH 32.3 31.8 31.7  MCHC 33.6 34.0 34.7  RDW 13.1 12.9 12.7  PLT 252 256 283    Cardiac EnzymesNo results for input(s): "TROPONINI" in the last 168 hours. No results for input(s): "TROPIPOC" in the last 168 hours.   BNP Recent Labs  Lab 12/19/21 2034 12/20/21 0536  BNP 1,080.8* 1,543.9*     DDimer No results for input(s): "DDIMER" in the last 168 hours.   Radiology    ECHOCARDIOGRAM COMPLETE  Result Date: 12/22/2021    ECHOCARDIOGRAM REPORT   Patient Name:   Joel Hart Date of Exam: 12/22/2021 Medical Rec #:  [id number]     Height:       67.0 in Accession #:    [id number]    Weight:       217.8 lb Date of Birth:  [birth year]     BSA:          2.097 m Patient Age:    66 years      BP:           126/57 mmHg Patient Gender: M             HR:           85 bpm. Exam Location:  Inpatient Procedure: 2D Echo Indications:    NSTEMI  History:        Patient has no prior history of Echocardiogram examinations.                 Signs/Symptoms:Altered Mental Status; Risk Factors:Hypertension.  Sonographer:    Johny Chess RDCS Referring Phys: Gans  Sonographer Comments: Suboptimal subcostal window.  IMPRESSIONS  1. Left ventricular ejection fraction, by estimation, is 55 to 60%. The left ventricle has normal function. The left ventricle demonstrates regional wall motion abnormalities (see scoring diagram/findings for description). Left ventricular diastolic parameters are indeterminate. There is akinesis of the left ventricular, basal inferior wall.  2. Right ventricular systolic function is severely reduced. The right ventricular size is moderately enlarged. Tricuspid regurgitation signal is inadequate for assessing PA pressure.  3. The mitral valve is normal in structure. No evidence of mitral valve regurgitation. No evidence of mitral stenosis.  4. The aortic valve is tricuspid. Aortic valve regurgitation is trivial. Aortic valve sclerosis/calcification is present, without any evidence of aortic stenosis. FINDINGS  Left Ventricle: Left ventricular ejection fraction, by estimation, is 55 to 60%. The left ventricle has normal function. The left ventricle demonstrates regional wall motion abnormalities. The left ventricular internal cavity size was normal in size. There is no left ventricular hypertrophy. Left ventricular diastolic parameters are indeterminate. Normal left ventricular filling pressure. Right Ventricle: The right ventricular size is moderately enlarged. No increase in right ventricular wall thickness. Right ventricular systolic function is severely reduced. Tricuspid regurgitation signal is inadequate for assessing PA pressure. Left [facility]: Left atrial size was normal in size. Right [facility]: Right atrial size was normal in size. Pericardium: There is no evidence of pericardial effusion. Mitral Valve: The mitral valve is normal in structure. No evidence of mitral valve regurgitation. No evidence of mitral valve stenosis. Tricuspid Valve: The tricuspid valve is normal in structure. Tricuspid valve regurgitation is trivial. No evidence of tricuspid stenosis. Aortic Valve: The aortic valve is  tricuspid. Aortic valve regurgitation is trivial. Aortic valve sclerosis/calcification is present, without any evidence of aortic  stenosis. Pulmonic Valve: The pulmonic valve was normal in structure. Pulmonic valve regurgitation is trivial. No evidence of pulmonic stenosis. Aorta: The aortic root is normal in size and structure. Venous: The inferior vena cava was not well visualized. IAS/Shunts: No atrial level shunt detected by color flow Doppler.  LEFT VENTRICLE PLAX 2D LVIDd:         4.70 cm   Diastology LVIDs:         3.40 cm   LV e' medial:    5.98 cm/s LV PW:         1.10 cm   LV E/e' medial:  13.1 LV IVS:        1.00 cm   LV e' lateral:   9.14 cm/s LVOT diam:     2.40 cm   LV E/e' lateral: 8.5 LV SV:         67 LV SV Index:   32 LVOT Area:     4.52 cm  RIGHT VENTRICLE RV S prime:     10.80 cm/s TAPSE (M-mode): 1.1 cm LEFT ATRIUM             Index        RIGHT ATRIUM           Index LA diam:        4.10 cm 1.96 cm/m   RA Area:     11.90 cm LA Vol (A2C):   62.8 ml 29.95 ml/m  RA Volume:   24.80 ml  11.83 ml/m LA Vol (A4C):   42.8 ml 20.41 ml/m LA Biplane Vol: 52.3 ml 24.94 ml/m  AORTIC VALVE LVOT Vmax:   79.50 cm/s LVOT Vmean:  54.800 cm/s LVOT VTI:    0.147 m  AORTA Ao Root diam: 3.10 cm Ao Asc diam:  2.90 cm MITRAL VALVE MV Area (PHT): 3.48 cm    SHUNTS MV Decel Time: 218 msec    Systemic VTI:  0.15 m MV E velocity: 78.10 cm/s  Systemic Diam: 2.40 cm MV A velocity: 71.80 cm/s MV E/A ratio:  1.09 Fransico Him MD Electronically signed by Fransico Him MD Signature Date/Time: 12/22/2021/11:02:46 AM    Final     Cardiac Studies   none  Patient Profile     66 y.o. male admitted with acute respiratory failure, and AMS.  Assessment & Plan    Acute on chronic diastolic heart failure - with RV failure - He has moderate RV dysfunction by echo. V/Q scan is pending. BP is improved. Note plans for right heart cath on Monday. NSTEMI - continue medical management. He is pain free.  Acute on chronic renal  failure - His creatinine continues to fall now 3.6 from a high of 4.6     For questions or updates, please contact [place] Please consult [email or web address] for contact info under Cardiology/STEMI.      Signed, Cristopher Peru, MD  12/23/2021, 9:36 AM

## 2021-12-23 NOTE — Progress Notes (Signed)
Sitter at bedside.

## 2021-12-23 NOTE — Progress Notes (Signed)
Pt upset in room about pain medication changes that have been made while he is in the hospital. Multiple attempts made to explain to pt rationale for changes to his morphine and gabapentin home meds. Dr Wyline Copas notified of pt's c/o. Pt increasingly upset and threatening to leave hospital to go home to get his medications. Pt becoming violent and belligerent with staff. Security up to room with pt. Agreed to go back to sit on side of bed. Pt agrees to take ordered meds now including Morphine IV and prn ativan ordered for increased anxiety. Report given to oncoming. Sitter to be ordered for safety.

## 2021-12-23 NOTE — Progress Notes (Signed)
Progress Note   Patient: Joel Hart DTO:[id number] DOB: Jan 27, 1956 DOA: 12/19/2021     4 DOS: the patient was seen and examined on 12/23/2021   Brief hospital course: 66yo with a long history of severe pain syndrome from trigeminal and occiptal neuralgia, followed by Dr. Maryruth Eve for pain management. He has recently been treated for a wound infection/abscess posterior neck with a round of Augmentin then a round of doxycycline. He has been having altered mental status and myclonic jerking for the last 24-48 hrs. Earlier on the day of admission he fell out of bed. Per his wife he was unarousable. For this reason EMS activated and patient brought to [facility] for evaluation as code stroke.     ED Course: Afebrile, 105/61 HR 74  RR 14. Patient appeared confused. Code Stroke called: CT head negative, neuro consult by Dr. Rory Percy - ruled out stroke. Lab revealed hyperkalemia at 6.2, acute renal failure with BUN 70 Cr 5.1. WBC elevated at 14.2 with 91/4/5. CT c-Spine negative but biapical ASD c/w edema, vs infection noted. CXR with central vascualr and perihilar ASD c/w CHF. Renal U/S negative. TRH called to admit for continue evaluation of new onset CHF, acute renal failure and to direct treatment.   Assessment and Plan: * Acute renal failure [facility]) Per old [facility] records patient carries dx of renal insufficiency since 2015 with a creatinine at that time of 1.3.  -Presenting with Cr of 5.1 -Pt was initially given gentle hydration for possible intravascular depletion. -Improved with IV lasix, now on hold -Indwelling cath placed with very good urine output -Appreciate input by Nephrology. Recs to start acute GN work up.  -Cr down to 3.67 today   Pulmonary edema Patient denies any cardiac history. Denies chest pain and denies acute SOB. Wife reports he has had a cough and is sedentary. CXR and CT reveal Air space disease strongly suggestive of pulmonary edema. BNP elevated to 1,080. EKG w/o acute changes.   -Patient presented with hypoxemia requiring 100% rebreather facemask and later BiPap -Given course of IV lasix with good output, now weaned to [facility] - 2d echo ordered by Cardiology, reviewed. Findings notable for akinesis of LV basal inferior wall and severely reduced RV function -VQ pending to r/o PE. Plans for RHC Monday  Pneumonia -Pt with recent temp of 100.69F on 8/9 with worsening infiltrates on CXR and leukocytosis -UA clear, blood cx pending -Have transitioned abx to azithro plus rocephin to better cover PNA  NSTEMI -Trop peaked to the 4000 range -Continued on heparin gtt -Per Cards, would ultimately benefit from RHC once renal function improves   Acute hyperkalemia Patient with hyperkalemia to 6.2 w/o EKG changes at presentation -resolved   Cellulitis and abscess of neck Patient with infection/abscess posterior neck at surgical site. He has had a course of augmentin and a course of doxycycline. -was given course of vanc initially -Area is not erythematous, indurated, or draining   Chronic pain syndrome Patient with trigeminal and occipatl neuralgia followed by Dr. Maryruth Eve. Has had implantable stimulators including left forehead and posterior neck. -In setting of renal failure, avoid morphine -Recs for hydromorphone, fentanyl, and methadone for pain control -Gabapentin renally dosed   Depression with anxiety Long standing problem of depression and anxiety related to chronic pain syndrome - Continue Klonopin and SSRI   HTN (hypertension) -BP is controlled. -Continue amolodipine -Cont to hold Zestril in light of acute renal failure       Subjective: Seen this AM, without complaints, in good  spirits  Physical Exam: Vitals:   12/23/21 1158 12/23/21 1200 12/23/21 1331 12/23/21 1401  BP: (!) 131/56 (!) 131/56    Pulse: 80 78 83 83  Resp: '16 17 15 15  '$ Temp: 98.3 F (36.8 C)     TempSrc: Oral     SpO2: 97% 94% 98% 96%  Weight:      Height:       General exam:  Conversant, in no acute distress Respiratory system: normal chest rise, clear, no audible wheezing Cardiovascular system: regular rhythm, s1-s2 Gastrointestinal system: Nondistended, nontender, pos BS Central nervous system: No seizures, no tremors Extremities: No cyanosis, no joint deformities Skin: No rashes, no pallor Psychiatry: Affect normal // no auditory hallucinations   Data Reviewed:  Labs reviewed: Na 138, K 4.0, Cr 3.67   Family Communication: Pt in room, family not at bedside  Disposition: Status is: Inpatient Remains inpatient appropriate because: Severity of illness  Planned Discharge Destination: Home    Author: Marylu Lund, MD 12/23/2021 5:12 PM  For on call review [email or web address].

## 2021-12-23 NOTE — Progress Notes (Signed)
At 2150H , came to the patient room to give due medications. Patient was agitated. Alert and oriented x 2. Removed his leads. Trying to leave the hospital. Patient said, "I've been waiting since 2 pm but nobody gave my Gabapentin and morphine. I am afraid I may have seizure. I leave now and go to the woods".  Also, he destroyed the integrity of the foley cath. Removed foley catheter at 2230H. Informed the wife and she agreed to put restrain to the patient. Education given to the wife and she acknowledged. Paged the security. Informed J. Howerter, DO via call and seen the patient at bedside. Restrain applied as ordered and haldol prn IV given.

## 2021-12-23 NOTE — Progress Notes (Signed)
Patient ID: Joel Hart, male   DOB: [birth year], 66 y.o.   MRN: [id number] S: Feeling a little more clear but now complaining of severe pain behind his eyes (his neuralgia). O:BP 130/63 (BP Location: Right Arm)   Pulse 81   Temp 98.4 F (36.9 C) (Oral)   Resp 19   Ht '5\' 7"'$  (1.702 m)   Wt 88 kg   SpO2 94%   BMI 30.38 kg/m   Intake/Output Summary (Last 24 hours) at 12/23/2021 1120 Last data filed at 12/23/2021 0500 Gross per 24 hour  Intake 797.21 ml  Output 2400 ml  Net -1602.79 ml   Intake/Output: I/O last 3 completed shifts: In: 1319.1 [P.O.:50; I.V.:130.1; IV Piggyback:1139] Out: 5200 [Urine:5200]  Intake/Output this shift:  No intake/output data recorded. Weight change:  Gen:NAD CVS: RRR Resp:CTA Abd:+BS,soft, NT/ND Ext: no edema   Recent Labs  Lab 12/19/21 2034 12/19/21 2040 12/19/21 2112 12/19/21 2209 12/20/21 0227 12/20/21 0651 12/21/21 0604 12/22/21 0335 12/23/21 0544  NA 133* 131*  --  131* 137 133* 136 137 138  K 6.4* 6.2* 4.9 6.1* 4.6 4.7 4.1 3.7 4.0  CL 100 101  --   --  100  --  100 97* 96*  CO2 20*  --   --   --  24  --  '25 28 30  '$ GLUCOSE 164* 156*  --   --  128*  --  129* 116* 103*  BUN 59* 70*  --   --  57*  --  66* 61* 64*  CREATININE 4.73* 5.10*  --   --  4.58*  --  4.61* 4.07* 3.67*  ALBUMIN 3.5  --   --   --   --   --   --  2.9*  --   CALCIUM 8.0*  --   --   --  8.2*  --  8.1* 8.3* 8.8*  AST 39  --   --   --   --   --   --  34  --   ALT 24  --   --   --   --   --   --  26  --    Liver Function Tests: Recent Labs  Lab 12/19/21 2034 12/22/21 0335  AST 39 34  ALT 24 26  ALKPHOS 70 62  BILITOT 0.5 0.5  PROT 6.4* 6.0*  ALBUMIN 3.5 2.9*   No results for input(s): "LIPASE", "AMYLASE" in the last 168 hours. Recent Labs  Lab 12/19/21 2039  AMMONIA 25   CBC: Recent Labs  Lab 12/19/21 2034 12/19/21 2040 12/20/21 0227 12/20/21 0651 12/21/21 0604 12/22/21 0335 12/23/21 0544  WBC 14.2*  --  15.7*  --  14.5* 12.1* 11.5*  NEUTROABS  12.9*  --   --   --   --   --   --   HGB 10.8*   < > 10.5*   < > 10.0* 10.1* 10.5*  HCT 32.8*   < > 31.7*   < > 29.8* 29.7* 30.3*  MCV 98.5  --  96.9  --  96.1 93.4 91.5  PLT 366  --  336  --  252 256 283   < > = values in this interval not displayed.   Cardiac Enzymes: Recent Labs  Lab 12/19/21 2115  CKTOTAL 275   CBG: Recent Labs  Lab 12/19/21 2007 12/19/21 2135 12/22/21 1416  GLUCAP 149* 135* 162*    Iron Studies:  Recent Labs    12/21/21 0604  IRON 13*  TIBC 293  FERRITIN 237   Studies/Results: ECHOCARDIOGRAM COMPLETE  Result Date: 12/22/2021    ECHOCARDIOGRAM REPORT   Patient Name:   Joel Hart Date of Exam: 12/22/2021 Medical Rec #:  [id number]     Height:       67.0 in Accession #:    [id number]    Weight:       217.8 lb Date of Birth:  November 11, 1955     BSA:          2.097 m Patient Age:    11 years      BP:           126/57 mmHg Patient Gender: M             HR:           85 bpm. Exam Location:  Inpatient Procedure: 2D Echo Indications:    NSTEMI  History:        Patient has no prior history of Echocardiogram examinations.                 Signs/Symptoms:Altered Mental Status; Risk Factors:Hypertension.  Sonographer:    Johny Chess RDCS Referring Phys: [place]  Sonographer Comments: Suboptimal subcostal window. IMPRESSIONS  1. Left ventricular ejection fraction, by estimation, is 55 to 60%. The left ventricle has normal function. The left ventricle demonstrates regional wall motion abnormalities (see scoring diagram/findings for description). Left ventricular diastolic parameters are indeterminate. There is akinesis of the left ventricular, basal inferior wall.  2. Right ventricular systolic function is severely reduced. The right ventricular size is moderately enlarged. Tricuspid regurgitation signal is inadequate for assessing PA pressure.  3. The mitral valve is normal in structure. No evidence of mitral valve regurgitation. No evidence of mitral stenosis.   4. The aortic valve is tricuspid. Aortic valve regurgitation is trivial. Aortic valve sclerosis/calcification is present, without any evidence of aortic stenosis. FINDINGS  Left Ventricle: Left ventricular ejection fraction, by estimation, is 55 to 60%. The left ventricle has normal function. The left ventricle demonstrates regional wall motion abnormalities. The left ventricular internal cavity size was normal in size. There is no left ventricular hypertrophy. Left ventricular diastolic parameters are indeterminate. Normal left ventricular filling pressure. Right Ventricle: The right ventricular size is moderately enlarged. No increase in right ventricular wall thickness. Right ventricular systolic function is severely reduced. Tricuspid regurgitation signal is inadequate for assessing PA pressure. Left Atrium: Left atrial size was normal in size. Right Atrium: Right atrial size was normal in size. Pericardium: There is no evidence of pericardial effusion. Mitral Valve: The mitral valve is normal in structure. No evidence of mitral valve regurgitation. No evidence of mitral valve stenosis. Tricuspid Valve: The tricuspid valve is normal in structure. Tricuspid valve regurgitation is trivial. No evidence of tricuspid stenosis. Aortic Valve: The aortic valve is tricuspid. Aortic valve regurgitation is trivial. Aortic valve sclerosis/calcification is present, without any evidence of aortic stenosis. Pulmonic Valve: The pulmonic valve was normal in structure. Pulmonic valve regurgitation is trivial. No evidence of pulmonic stenosis. Aorta: The aortic root is normal in size and structure. Venous: The inferior vena cava was not well visualized. IAS/Shunts: No atrial level shunt detected by color flow Doppler.  LEFT VENTRICLE PLAX 2D LVIDd:         4.70 cm   Diastology LVIDs:         3.40 cm   LV e' medial:    5.98 cm/s LV PW:  1.10 cm   LV E/e' medial:  13.1 LV IVS:        1.00 cm   LV e' lateral:   9.14 cm/s LVOT  diam:     2.40 cm   LV E/e' lateral: 8.5 LV SV:         67 LV SV Index:   32 LVOT Area:     4.52 cm  RIGHT VENTRICLE RV S prime:     10.80 cm/s TAPSE (M-mode): 1.1 cm LEFT ATRIUM             Index        RIGHT ATRIUM           Index LA diam:        4.10 cm 1.96 cm/m   RA Area:     11.90 cm LA Vol (A2C):   62.8 ml 29.95 ml/m  RA Volume:   24.80 ml  11.83 ml/m LA Vol (A4C):   42.8 ml 20.41 ml/m LA Biplane Vol: 52.3 ml 24.94 ml/m  AORTIC VALVE LVOT Vmax:   79.50 cm/s LVOT Vmean:  54.800 cm/s LVOT VTI:    0.147 m  AORTA Ao Root diam: 3.10 cm Ao Asc diam:  2.90 cm MITRAL VALVE MV Area (PHT): 3.48 cm    SHUNTS MV Decel Time: 218 msec    Systemic VTI:  0.15 m MV E velocity: 78.10 cm/s  Systemic Diam: 2.40 cm MV A velocity: 71.80 cm/s MV E/A ratio:  1.09 Fransico Him MD Electronically signed by Fransico Him MD Signature Date/Time: 12/22/2021/11:02:46 AM    Final     aspirin EC  81 mg Oral Daily   Chlorhexidine Gluconate Cloth  6 each Topical Daily   vitamin B-12  1,000 mcg Oral Daily   folic acid  1 mg Oral Daily   gabapentin  300 mg Oral TID   heparin injection (subcutaneous)  5,000 Units Subcutaneous Q8H   PARoxetine  30 mg Oral Daily   rosuvastatin  10 mg Oral Daily   senna  1 tablet Oral BID   sodium chloride flush  3 mL Intravenous Once   sodium chloride flush  3 mL Intravenous Q12H    BMET    Component Value Date/Time   NA 138 12/23/2021 0544   NA 139 04/30/2013 1622   K 4.0 12/23/2021 0544   K 4.5 04/30/2013 1622   CL 96 (L) 12/23/2021 0544   CL 104 04/30/2013 1622   CO2 30 12/23/2021 0544   CO2 29 04/30/2013 1622   GLUCOSE 103 (H) 12/23/2021 0544   GLUCOSE 109 (H) 04/30/2013 1622   BUN 64 (H) 12/23/2021 0544   BUN 15 04/30/2013 1622   CREATININE 3.67 (H) 12/23/2021 0544   CREATININE 1.33 (H) 04/30/2013 1622   CALCIUM 8.8 (L) 12/23/2021 0544   CALCIUM 9.4 04/30/2013 1622   GFRNONAA 17 (L) 12/23/2021 0544   GFRNONAA 58 (L) 04/30/2013 1622   GFRAA 63 (L) 10/08/2013 1023    GFRAA >60 04/30/2013 1622   CBC    Component Value Date/Time   WBC 11.5 (H) 12/23/2021 0544   RBC 3.31 (L) 12/23/2021 0544   HGB 10.5 (L) 12/23/2021 0544   HCT 30.3 (L) 12/23/2021 0544   PLT 283 12/23/2021 0544   MCV 91.5 12/23/2021 0544   MCH 31.7 12/23/2021 0544   MCHC 34.7 12/23/2021 0544   RDW 12.7 12/23/2021 0544   LYMPHSABS 0.6 (L) 12/19/2021 2034   MONOABS 0.7 12/19/2021 2034   EOSABS 0.0 12/19/2021  2034   BASOSABS 0.0 12/19/2021 2034    Assessment/Plan:  AKI - unclear etiology.  Renal US without obstruction or abnormalities.  UA with moderate blood but following catheter placement.  UNa <10, FeNa 0.19% consistent with pre-renal causes and may be dehydrated due to AMS for the past week.  His elevated BNP and CXR support pulmonary edema.  Will start acute GN workup which can also have low FeNa.  Given his significant UOP, low FeNa, low BP, and RV dysfunction will continue to hold further IV lasix for now and follow.  BUN/Cr already improving.  No indication for dialysis at this time.  Will continue to follow closely.  Avoid nephrotoxic medications including NSAIDs and iodinated intravenous contrast exposure unless the latter is absolutely indicated. Preferred narcotic agents for pain control are hydromorphone, fentanyl, and methadone. Morphine should not be used.  Avoid Baclofen and avoid oral sodium phosphate and magnesium citrate based laxatives / bowel preps.  Continue strict Input and Output monitoring.  Will monitor the patient closely with you and intervene or adjust therapy as indicated by changes in clinical status/labs  Acute metabolic encephalopathy - negative for CVA and may be related to pain meds as well as AKI.  Improving.   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  Preserved EF. Diuretics on hold.  W/u per Cardiology and Heart failure team NSTEMI - Cardiology following Pulmonary edema - mild, seen on CXR with rising BNP.  Would also recommend repeat  imaging to r/o pneumonia give elevated WBC and AMS.  PNA would also increase BNP.  He has received 3 doses of IV lasix at 40 mg and produced almost 1500 mL today.  Lasix held for now as above given low FeNa and AKI.  No peripheral edema.  diuresed 2.6 liters overnight but CXR this morning is worsening diffuse hazy opacification.  I am concerned that this may actually be a pneumonia.  Awaiting ECHO, he refused yesterday.  Cardiology following.  I did decrease furosemide to 40 mg daily given vigorous diuresis and reduced RV function.  Hyperkalemia - improved with medical therapy and lasix. Hyponatremia - improving Anemia - new, will continue to follow.  Will order SPEP/UPEP and iron stores.  Donetta Potts, MD [facility]

## 2021-12-24 DIAGNOSIS — I5033 Acute on chronic diastolic (congestive) heart failure: Secondary | ICD-10-CM | POA: Diagnosis not present

## 2021-12-24 DIAGNOSIS — L0211 Cutaneous abscess of neck: Secondary | ICD-10-CM | POA: Diagnosis not present

## 2021-12-24 DIAGNOSIS — L03221 Cellulitis of neck: Secondary | ICD-10-CM | POA: Diagnosis not present

## 2021-12-24 DIAGNOSIS — I249 Acute ischemic heart disease, unspecified: Secondary | ICD-10-CM | POA: Diagnosis not present

## 2021-12-24 DIAGNOSIS — N179 Acute kidney failure, unspecified: Secondary | ICD-10-CM | POA: Diagnosis not present

## 2021-12-24 LAB — BASIC METABOLIC PANEL
Anion gap: 14 (ref 5–15)
BUN: 57 mg/dL — ABNORMAL HIGH (ref 8–23)
CO2: 26 mmol/L (ref 22–32)
Calcium: 8.9 mg/dL (ref 8.9–10.3)
Chloride: 98 mmol/L (ref 98–111)
Creatinine, Ser: 2.9 mg/dL — ABNORMAL HIGH (ref 0.61–1.24)
GFR, Estimated: 23 mL/min — ABNORMAL LOW (ref >60)
Glucose, Bld: 150 mg/dL — ABNORMAL HIGH (ref 70–99)
Potassium: 3.4 mmol/L — ABNORMAL LOW (ref 3.5–5.1)
Sodium: 138 mmol/L (ref 135–145)

## 2021-12-24 LAB — CBC
HCT: 32.9 % — ABNORMAL LOW (ref 39.0–52.0)
Hemoglobin: 11.5 g/dL — ABNORMAL LOW (ref 13.0–17.0)
MCH: 32.2 pg (ref 26.0–34.0)
MCHC: 35 g/dL (ref 30.0–36.0)
MCV: 92.2 fL (ref 80.0–100.0)
Platelets: 112 10*3/uL — ABNORMAL LOW (ref 150–400)
RBC: 3.57 MIL/uL — ABNORMAL LOW (ref 4.22–5.81)
RDW: 12.7 % (ref 11.5–15.5)
WBC: 6.2 10*3/uL (ref 4.0–10.5)
nRBC: 0 % (ref 0.0–0.2)

## 2021-12-24 MED ORDER — ROSUVASTATIN CALCIUM 10 MG PO TABS: 10.0000 mg | ORAL_TABLET | Freq: Every day | ORAL | 0 refills | Status: AC

## 2021-12-24 MED ORDER — AZITHROMYCIN 250 MG PO TABS: 250.0000 mg | ORAL_TABLET | Freq: Every day | ORAL | 0 refills | Status: AC

## 2021-12-24 MED ORDER — CEFDINIR 300 MG PO CAPS: 300.0000 mg | ORAL_CAPSULE | Freq: Two times a day (BID) | ORAL | 0 refills | Status: AC

## 2021-12-24 MED ORDER — SODIUM CHLORIDE 0.9 % IV SOLN: 250.0000 mL | INTRAVENOUS | Status: DC | PRN

## 2021-12-24 MED ORDER — SODIUM CHLORIDE 0.9% FLUSH: 3.0000 mL | INTRAVENOUS | Status: DC | PRN

## 2021-12-24 MED ORDER — GABAPENTIN 300 MG PO CAPS
300.0000 mg | ORAL_CAPSULE | Freq: Three times a day (TID) | ORAL | 0 refills | Status: AC
Start: 2021-12-24 — End: 2021-12-31

## 2021-12-24 MED ORDER — ASPIRIN 81 MG PO TBEC: 81.0000 mg | DELAYED_RELEASE_TABLET | Freq: Every day | ORAL | 0 refills | Status: AC

## 2021-12-24 MED ORDER — ORAL CARE MOUTH RINSE: 15.0000 mL | OROMUCOSAL | Status: DC | PRN

## 2021-12-24 MED ORDER — ASPIRIN 81 MG PO CHEW: 81.0000 mg | CHEWABLE_TABLET | ORAL | Status: DC

## 2021-12-24 MED ORDER — SODIUM CHLORIDE 0.9 % IV SOLN: INTRAVENOUS | Status: DC

## 2021-12-24 NOTE — Progress Notes (Signed)
Patient verbalized concern regarding his home meds morphine and gabapentin. Explained to patient regarding the rationale based on MD notes. Informed patient that he can have the morphine IV around 2200H together with the other medications. Patient agreed.

## 2021-12-24 NOTE — Progress Notes (Signed)
Patient refused CPAP for the night  

## 2021-12-24 NOTE — H&P (View-Only) (Signed)
Progress Note  Patient Name: Joel Hart Date of Encounter: 12/24/2021  Primary Cardiologist: None   Subjective   "Doc, I have got to get out of here. I will come back tomorrow for my procedure." Denies chest pain or sob. Note agitation last night.  Inpatient Medications    Scheduled Meds:  [START ON 12/25/2021] aspirin  81 mg Oral Pre-Cath   aspirin EC  81 mg Oral Daily   azithromycin  500 mg Oral Daily   vitamin B-12  1,000 mcg Oral Daily   folic acid  1 mg Oral Daily   gabapentin  300 mg Oral TID   heparin injection (subcutaneous)  5,000 Units Subcutaneous Q8H   PARoxetine  30 mg Oral Daily   polyethylene glycol  17 g Oral Daily   rosuvastatin  10 mg Oral Daily   senna  1 tablet Oral BID   sodium chloride flush  3 mL Intravenous Once   sodium chloride flush  3 mL Intravenous Q12H   Continuous Infusions:  sodium chloride     [START ON 12/25/2021] sodium chloride     cefTRIAXone (ROCEPHIN)  IV 2 g (12/23/21 1732)   PRN Meds: sodium chloride, acetaminophen, clonazePAM, haloperidol lactate, LORazepam, morphine injection, ondansetron (ZOFRAN) IV, mouth rinse, sodium chloride flush, tiZANidine   Vital Signs    Vitals:   12/23/21 2034 12/23/21 2300 12/24/21 0432 12/24/21 0433  BP: (!) 171/70 (!) 168/74  (!) 128/57  Pulse: 93 (!) 107  71  Resp: '15 14  17  '$ Temp: 98.4 F (36.9 C)   97.7 F (36.5 C)  TempSrc: Oral   Oral  SpO2: 96% 98%  96%  Weight:   91.3 kg   Height:        Intake/Output Summary (Last 24 hours) at 12/24/2021 0958 Last data filed at 12/24/2021 0600 Gross per 24 hour  Intake 960 ml  Output 2850 ml  Net -1890 ml   Filed Weights   12/20/21 1126 12/23/21 0535 12/24/21 0432  Weight: 98.8 kg 88 kg 91.3 kg    Telemetry    NSR - Personally Reviewed  ECG    none - Personally Reviewed  Physical Exam   GEN: No acute distress.   Neck: 6 cm JVD, draining sinus back of the neck with no erythema Cardiac: RRR, no murmurs, rubs, or gallops.   Respiratory: Clear to auscultation bilaterally. GI: Soft, nontender, non-distended  MS: No edema; No deformity. Neuro:  Nonfocal but Alert and oriented X 4. Psych: Normal affect   Labs    Chemistry Recent Labs  Lab 12/19/21 2034 12/19/21 2040 12/21/21 0604 12/22/21 0335 12/23/21 0544  NA 133*   < > 136 137 138  K 6.4*   < > 4.1 3.7 4.0  CL 100   < > 100 97* 96*  CO2 20*   < > '25 28 30  '$ GLUCOSE 164*   < > 129* 116* 103*  BUN 59*   < > 66* 61* 64*  CREATININE 4.73*   < > 4.61* 4.07* 3.67*  CALCIUM 8.0*   < > 8.1* 8.3* 8.8*  PROT 6.4*  --   --  6.0*  --   ALBUMIN 3.5  --   --  2.9*  --   AST 39  --   --  34  --   ALT 24  --   --  26  --   ALKPHOS 70  --   --  62  --   BILITOT 0.5  --   --  0.5  --   GFRNONAA 13*   < > 13* 15* 17*  ANIONGAP 13   < > '11 12 12   '$ < > = values in this interval not displayed.     Hematology Recent Labs  Lab 12/21/21 0604 12/22/21 0335 12/23/21 0544  WBC 14.5* 12.1* 11.5*  RBC 3.10* 3.18* 3.31*  HGB 10.0* 10.1* 10.5*  HCT 29.8* 29.7* 30.3*  MCV 96.1 93.4 91.5  MCH 32.3 31.8 31.7  MCHC 33.6 34.0 34.7  RDW 13.1 12.9 12.7  PLT 252 256 283    Cardiac EnzymesNo results for input(s): "TROPONINI" in the last 168 hours. No results for input(s): "TROPIPOC" in the last 168 hours.   BNP Recent Labs  Lab 12/19/21 2034 12/20/21 0536  BNP 1,080.8* 1,543.9*     DDimer No results for input(s): "DDIMER" in the last 168 hours.   Radiology    ECHOCARDIOGRAM COMPLETE  Result Date: 12/22/2021    ECHOCARDIOGRAM REPORT   Patient Name:   Moussa Dipaola Date of Exam: 12/22/2021 Medical Rec #:  [id number]     Height:       67.0 in Accession #:    [id number]    Weight:       217.8 lb Date of Birth:  [birth year]     BSA:          2.097 m Patient Age:    66 years      BP:           126/57 mmHg Patient Gender: M             HR:           85 bpm. Exam Location:  Inpatient Procedure: 2D Echo Indications:    NSTEMI  History:        Patient has no prior history  of Echocardiogram examinations.                 Signs/Symptoms:Altered Mental Status; Risk Factors:Hypertension.  Sonographer:    Johny Chess RDCS Referring Phys: [place]  Sonographer Comments: Suboptimal subcostal window. IMPRESSIONS  1. Left ventricular ejection fraction, by estimation, is 55 to 60%. The left ventricle has normal function. The left ventricle demonstrates regional wall motion abnormalities (see scoring diagram/findings for description). Left ventricular diastolic parameters are indeterminate. There is akinesis of the left ventricular, basal inferior wall.  2. Right ventricular systolic function is severely reduced. The right ventricular size is moderately enlarged. Tricuspid regurgitation signal is inadequate for assessing PA pressure.  3. The mitral valve is normal in structure. No evidence of mitral valve regurgitation. No evidence of mitral stenosis.  4. The aortic valve is tricuspid. Aortic valve regurgitation is trivial. Aortic valve sclerosis/calcification is present, without any evidence of aortic stenosis. FINDINGS  Left Ventricle: Left ventricular ejection fraction, by estimation, is 55 to 60%. The left ventricle has normal function. The left ventricle demonstrates regional wall motion abnormalities. The left ventricular internal cavity size was normal in size. There is no left ventricular hypertrophy. Left ventricular diastolic parameters are indeterminate. Normal left ventricular filling pressure. Right Ventricle: The right ventricular size is moderately enlarged. No increase in right ventricular wall thickness. Right ventricular systolic function is severely reduced. Tricuspid regurgitation signal is inadequate for assessing PA pressure. Left Atrium: Left atrial size was normal in size. Right Atrium: Right atrial size was normal in size. Pericardium: There is no evidence of pericardial effusion. Mitral Valve: The mitral valve is normal in structure. No evidence of  mitral valve regurgitation. No evidence of mitral valve stenosis. Tricuspid Valve: The tricuspid valve is normal in structure. Tricuspid valve regurgitation is trivial. No evidence of tricuspid stenosis. Aortic Valve: The aortic valve is tricuspid. Aortic valve regurgitation is trivial. Aortic valve sclerosis/calcification is present, without any evidence of aortic stenosis. Pulmonic Valve: The pulmonic valve was normal in structure. Pulmonic valve regurgitation is trivial. No evidence of pulmonic stenosis. Aorta: The aortic root is normal in size and structure. Venous: The inferior vena cava was not well visualized. IAS/Shunts: No atrial level shunt detected by color flow Doppler.  LEFT VENTRICLE PLAX 2D LVIDd:         4.70 cm   Diastology LVIDs:         3.40 cm   LV e' medial:    5.98 cm/s LV PW:         1.10 cm   LV E/e' medial:  13.1 LV IVS:        1.00 cm   LV e' lateral:   9.14 cm/s LVOT diam:     2.40 cm   LV E/e' lateral: 8.5 LV SV:         67 LV SV Index:   32 LVOT Area:     4.52 cm  RIGHT VENTRICLE RV S prime:     10.80 cm/s TAPSE (M-mode): 1.1 cm LEFT ATRIUM             Index        RIGHT ATRIUM           Index LA diam:        4.10 cm 1.96 cm/m   RA Area:     11.90 cm LA Vol (A2C):   62.8 ml 29.95 ml/m  RA Volume:   24.80 ml  11.83 ml/m LA Vol (A4C):   42.8 ml 20.41 ml/m LA Biplane Vol: 52.3 ml 24.94 ml/m  AORTIC VALVE LVOT Vmax:   79.50 cm/s LVOT Vmean:  54.800 cm/s LVOT VTI:    0.147 m  AORTA Ao Root diam: 3.10 cm Ao Asc diam:  2.90 cm MITRAL VALVE MV Area (PHT): 3.48 cm    SHUNTS MV Decel Time: 218 msec    Systemic VTI:  0.15 m MV E velocity: 78.10 cm/s  Systemic Diam: 2.40 cm MV A velocity: 71.80 cm/s MV E/A ratio:  1.09 Tressia Miners Turner MD Electronically signed by Fransico Him MD Signature Date/Time: 12/22/2021/11:02:46 AM    Final     Cardiac Studies   See above  Patient Profile     66 y.o. male admitted with acute on chronic diastolic heart failure and RV failure   Assessment & Plan     Acute on chronic diastolic heart failure with RV dysfunction - he is pending right heart cath tomorrow. I think it would be safe for him to go home and come back for the procedure. Agitation - he is calm and A & O X4 today. He would be better off at home in familiar surroundings. Acute on chronic renal failure - no labs today. Kidney function was better yesterday.  Disp - ok to leave and return tomorrow for right heart cath. Might be best to send out on a pass.     For questions or updates, please contact [place] Please consult [email or web address] for contact info under Cardiology/STEMI.      Signed, Cristopher Peru, MD  12/24/2021, 9:58 AM

## 2021-12-24 NOTE — Progress Notes (Signed)
Progress Note  Patient Name: Joel Hart Date of Encounter: 12/24/2021  Primary Cardiologist: None   Subjective   "Doc, I have got to get out of here. I will come back tomorrow for my procedure." Denies chest pain or sob. Note agitation last night.  Inpatient Medications    Scheduled Meds:  [START ON 12/25/2021] aspirin  81 mg Oral Pre-Cath   aspirin EC  81 mg Oral Daily   azithromycin  500 mg Oral Daily   vitamin B-12  1,000 mcg Oral Daily   folic acid  1 mg Oral Daily   gabapentin  300 mg Oral TID   heparin injection (subcutaneous)  5,000 Units Subcutaneous Q8H   PARoxetine  30 mg Oral Daily   polyethylene glycol  17 g Oral Daily   rosuvastatin  10 mg Oral Daily   senna  1 tablet Oral BID   sodium chloride flush  3 mL Intravenous Once   sodium chloride flush  3 mL Intravenous Q12H   Continuous Infusions:  sodium chloride     [START ON 12/25/2021] sodium chloride     cefTRIAXone (ROCEPHIN)  IV 2 g (12/23/21 1732)   PRN Meds: sodium chloride, acetaminophen, clonazePAM, haloperidol lactate, LORazepam, morphine injection, ondansetron (ZOFRAN) IV, mouth rinse, sodium chloride flush, tiZANidine   Vital Signs    Vitals:   12/23/21 2034 12/23/21 2300 12/24/21 0432 12/24/21 0433  BP: (!) 171/70 (!) 168/74  (!) 128/57  Pulse: 93 (!) 107  71  Resp: '15 14  17  '$ Temp: 98.4 F (36.9 C)   97.7 F (36.5 C)  TempSrc: Oral   Oral  SpO2: 96% 98%  96%  Weight:   91.3 kg   Height:        Intake/Output Summary (Last 24 hours) at 12/24/2021 0958 Last data filed at 12/24/2021 0600 Gross per 24 hour  Intake 960 ml  Output 2850 ml  Net -1890 ml   Filed Weights   12/20/21 1126 12/23/21 0535 12/24/21 0432  Weight: 98.8 kg 88 kg 91.3 kg    Telemetry    NSR - Personally Reviewed  ECG    none - Personally Reviewed  Physical Exam   GEN: No acute distress.   Neck: 6 cm JVD, draining sinus back of the neck with no erythema Cardiac: RRR, no murmurs, rubs, or gallops.   Respiratory: Clear to auscultation bilaterally. GI: Soft, nontender, non-distended  MS: No edema; No deformity. Neuro:  Nonfocal but Alert and oriented X 4. Psych: Normal affect   Labs    Chemistry Recent Labs  Lab 12/19/21 2034 12/19/21 2040 12/21/21 0604 12/22/21 0335 12/23/21 0544  NA 133*   < > 136 137 138  K 6.4*   < > 4.1 3.7 4.0  CL 100   < > 100 97* 96*  CO2 20*   < > '25 28 30  '$ GLUCOSE 164*   < > 129* 116* 103*  BUN 59*   < > 66* 61* 64*  CREATININE 4.73*   < > 4.61* 4.07* 3.67*  CALCIUM 8.0*   < > 8.1* 8.3* 8.8*  PROT 6.4*  --   --  6.0*  --   ALBUMIN 3.5  --   --  2.9*  --   AST 39  --   --  34  --   ALT 24  --   --  26  --   ALKPHOS 70  --   --  62  --   BILITOT 0.5  --   --  0.5  --   GFRNONAA 13*   < > 13* 15* 17*  ANIONGAP 13   < > '11 12 12   '$ < > = values in this interval not displayed.     Hematology Recent Labs  Lab 12/21/21 0604 12/22/21 0335 12/23/21 0544  WBC 14.5* 12.1* 11.5*  RBC 3.10* 3.18* 3.31*  HGB 10.0* 10.1* 10.5*  HCT 29.8* 29.7* 30.3*  MCV 96.1 93.4 91.5  MCH 32.3 31.8 31.7  MCHC 33.6 34.0 34.7  RDW 13.1 12.9 12.7  PLT 252 256 283    Cardiac EnzymesNo results for input(s): "TROPONINI" in the last 168 hours. No results for input(s): "TROPIPOC" in the last 168 hours.   BNP Recent Labs  Lab 12/19/21 2034 12/20/21 0536  BNP 1,080.8* 1,543.9*     DDimer No results for input(s): "DDIMER" in the last 168 hours.   Radiology    ECHOCARDIOGRAM COMPLETE  Result Date: 12/22/2021    ECHOCARDIOGRAM REPORT   Patient Name:   Joel Hart Date of Exam: 12/22/2021 Medical Rec #:  [id number]     Height:       67.0 in Accession #:    [id number]    Weight:       217.8 lb Date of Birth:  [birth year]     BSA:          2.097 m Patient Age:    66 years      BP:           126/57 mmHg Patient Gender: M             HR:           85 bpm. Exam Location:  Inpatient Procedure: 2D Echo Indications:    NSTEMI  History:        Patient has no prior history  of Echocardiogram examinations.                 Signs/Symptoms:Altered Mental Status; Risk Factors:Hypertension.  Sonographer:    Johny Chess RDCS Referring Phys: [place]  Sonographer Comments: Suboptimal subcostal window. IMPRESSIONS  1. Left ventricular ejection fraction, by estimation, is 55 to 60%. The left ventricle has normal function. The left ventricle demonstrates regional wall motion abnormalities (see scoring diagram/findings for description). Left ventricular diastolic parameters are indeterminate. There is akinesis of the left ventricular, basal inferior wall.  2. Right ventricular systolic function is severely reduced. The right ventricular size is moderately enlarged. Tricuspid regurgitation signal is inadequate for assessing PA pressure.  3. The mitral valve is normal in structure. No evidence of mitral valve regurgitation. No evidence of mitral stenosis.  4. The aortic valve is tricuspid. Aortic valve regurgitation is trivial. Aortic valve sclerosis/calcification is present, without any evidence of aortic stenosis. FINDINGS  Left Ventricle: Left ventricular ejection fraction, by estimation, is 55 to 60%. The left ventricle has normal function. The left ventricle demonstrates regional wall motion abnormalities. The left ventricular internal cavity size was normal in size. There is no left ventricular hypertrophy. Left ventricular diastolic parameters are indeterminate. Normal left ventricular filling pressure. Right Ventricle: The right ventricular size is moderately enlarged. No increase in right ventricular wall thickness. Right ventricular systolic function is severely reduced. Tricuspid regurgitation signal is inadequate for assessing PA pressure. Left Atrium: Left atrial size was normal in size. Right Atrium: Right atrial size was normal in size. Pericardium: There is no evidence of pericardial effusion. Mitral Valve: The mitral valve is normal in structure. No evidence of  mitral valve regurgitation. No evidence of mitral valve stenosis. Tricuspid Valve: The tricuspid valve is normal in structure. Tricuspid valve regurgitation is trivial. No evidence of tricuspid stenosis. Aortic Valve: The aortic valve is tricuspid. Aortic valve regurgitation is trivial. Aortic valve sclerosis/calcification is present, without any evidence of aortic stenosis. Pulmonic Valve: The pulmonic valve was normal in structure. Pulmonic valve regurgitation is trivial. No evidence of pulmonic stenosis. Aorta: The aortic root is normal in size and structure. Venous: The inferior vena cava was not well visualized. IAS/Shunts: No atrial level shunt detected by color flow Doppler.  LEFT VENTRICLE PLAX 2D LVIDd:         4.70 cm   Diastology LVIDs:         3.40 cm   LV e' medial:    5.98 cm/s LV PW:         1.10 cm   LV E/e' medial:  13.1 LV IVS:        1.00 cm   LV e' lateral:   9.14 cm/s LVOT diam:     2.40 cm   LV E/e' lateral: 8.5 LV SV:         67 LV SV Index:   32 LVOT Area:     4.52 cm  RIGHT VENTRICLE RV S prime:     10.80 cm/s TAPSE (M-mode): 1.1 cm LEFT ATRIUM             Index        RIGHT ATRIUM           Index LA diam:        4.10 cm 1.96 cm/m   RA Area:     11.90 cm LA Vol (A2C):   62.8 ml 29.95 ml/m  RA Volume:   24.80 ml  11.83 ml/m LA Vol (A4C):   42.8 ml 20.41 ml/m LA Biplane Vol: 52.3 ml 24.94 ml/m  AORTIC VALVE LVOT Vmax:   79.50 cm/s LVOT Vmean:  54.800 cm/s LVOT VTI:    0.147 m  AORTA Ao Root diam: 3.10 cm Ao Asc diam:  2.90 cm MITRAL VALVE MV Area (PHT): 3.48 cm    SHUNTS MV Decel Time: 218 msec    Systemic VTI:  0.15 m MV E velocity: 78.10 cm/s  Systemic Diam: 2.40 cm MV A velocity: 71.80 cm/s MV E/A ratio:  1.09 Tressia Miners Turner MD Electronically signed by Fransico Him MD Signature Date/Time: 12/22/2021/11:02:46 AM    Final     Cardiac Studies   See above  Patient Profile     66 y.o. male admitted with acute on chronic diastolic heart failure and RV failure   Assessment & Plan     Acute on chronic diastolic heart failure with RV dysfunction - he is pending right heart cath tomorrow. I think it would be safe for him to go home and come back for the procedure. Agitation - he is calm and A & O X4 today. He would be better off at home in familiar surroundings. Acute on chronic renal failure - no labs today. Kidney function was better yesterday.  Disp - ok to leave and return tomorrow for right heart cath. Might be best to send out on a pass.     For questions or updates, please contact [facility] Please consult [email or web address] for contact info under Cardiology/STEMI.      Signed, Cristopher Peru, MD  12/24/2021, 9:58 AM

## 2021-12-24 NOTE — Progress Notes (Signed)
Pt leaving AMA; he did sign the paperwork. Instructions given to return tomorrow at 1300 for his heart cath at 1500. Pt verbalized understanding. IV removed.

## 2021-12-24 NOTE — Progress Notes (Signed)
Refused blood works. Education given. Needs further teachings. Will follow up.

## 2021-12-24 NOTE — Progress Notes (Signed)
[facility] night cross cover note:   I was notified by RN that the patient remains agitated, confused (oriented only to self and place), and is attempting to leave.  Initially he was redirectable with frequent verbal redirection, prompting an order for one-to-one sitter for the safety of the patient.   However, the patient's agitation and attempts to leave subsequently became refractory even to these frequent redirection efforts, prompting the patient to be temporarily placed in soft bilateral wrist restraints.  He also received a dose of prn IV Haldol.   I evaluated the patient at bedside, and the patient conveyed his concerns regarding the lower doses of gabapentin and morphine that he has been receiving in the hospital relative to his outpatient dosing.  I attempted to clarify/reassure that we are attempting to proceed with caution/renal dosing of multiple of his home medications that have potential nephrotoxic implications in the setting of his presenting AKI, and that we will attempt to continue to adjust these doses as his renal function improves.   Patient ultimately became less agitated, and soft restraints were discontinued.     Babs Bertin, DO Hospitalist

## 2021-12-24 NOTE — Discharge Summary (Signed)
Physician Discharge Summary   Patient: Joel Hart MRN: [id number] DOB: Sep 12, 1955  Admit date:     12/19/2021  Discharge date: 12/24/21  Discharge Physician: Marylu Lund   PCP: Pcp, No   Patient Left Against Medical Advice  Discharge Diagnoses: Principal Problem:   Acute renal failure [facility]) Active Problems:   Acute hyperkalemia   Pulmonary edema   Depression with anxiety   Chronic pain syndrome   Cellulitis and abscess of neck   HTN (hypertension)   ACS (acute coronary syndrome) ([place])  Resolved Problems:   High triglycerides  Hospital Course: [phone] with a long history of severe pain syndrome from trigeminal and occiptal neuralgia, followed by Dr. Maryruth Eve for pain management. He has recently been treated for a wound infection/abscess posterior neck with a round of Augmentin then a round of doxycycline. He has been having altered mental status and myclonic jerking for the last 24-48 hrs. Earlier on the day of admission he fell out of bed. Per his wife he was unarousable. For this reason EMS activated and patient brought to [facility] for evaluation as code stroke.     ED Course: Afebrile, 105/61 HR 74  RR 14. Patient appeared confused. Code Stroke called: CT head negative, neuro consult by Dr. Rory Percy - ruled out stroke. Lab revealed hyperkalemia at 6.2, acute renal failure with BUN 70 Cr 5.1. WBC elevated at 14.2 with 91/4/5. CT c-Spine negative but biapical ASD c/w edema, vs infection noted. CXR with central vascualr and perihilar ASD c/w CHF. Renal U/S negative. TRH called to admit for continue evaluation of new onset CHF, acute renal failure and to direct treatment.   Assessment and Plan: * Acute renal failure [facility]) Per old [facility] records patient carries dx of renal insufficiency since 2015 with a creatinine at that time of 1.3.  -Presenting with Cr of 5.1 -Pt was initially given gentle hydration for possible intravascular depletion. -Improved with IV lasix, now on hold -Indwelling  cath placed with very good urine output -Appreciate input by Nephrology. Recs to start acute GN work up.  -Cr down to 3.67 on 8/12. Pt refused further bloodwork 8/13 -On 8/13, patient elected to leave AMA. Pt was able to verbalize to me person, place, time, his current diagnoses, and treatment entailed without any issue. Pt also verbalized that he is aware that he may decompensate if leaving prematurely and that he "can die." Pt has demonstrated decision making capacity.  -Pt was also seen by Nephrology, who also stressed importance of continued stay, again pt deciding on leaving AMA. See Nephrology note.   Pulmonary edema Patient denies any cardiac history. Denies chest pain and denies acute SOB. Wife reports he has had a cough and is sedentary. CXR and CT reveal Air space disease strongly suggestive of pulmonary edema. BNP elevated to 1,080. EKG w/o acute changes.  -Patient presented with hypoxemia requiring 100% rebreather facemask and later BiPap -Given course of IV lasix with good output, now weaned to [facility] - 2d echo ordered by Cardiology, reviewed. Findings notable for akinesis of LV basal inferior wall and severely reduced RV function -VQ was ordered to r/o PE, was not done prior to leaving AMA -Discussed with Cardiology. Pt is scheduled for outpt heart cath 8/14. Have instructed pt to go to scheduled heart cath   Pneumonia -Pt with recent temp of 100.83F on 8/9 with worsening infiltrates on CXR and leukocytosis -UA clear, blood cx neg -Had transitioned abx to azithro plus rocephin to better cover PNA -Omnicef and azithro  prescribed at time of pt leaving AMA   NSTEMI -Trop peaked to the 4000 range -Continued on heparin gtt this visit -Per Cards, plan for heart cath 8/14 as outpatient   Acute hyperkalemia Patient with hyperkalemia to 6.2 w/o EKG changes at presentation -resolved   Cellulitis and abscess of neck Patient with infection/abscess posterior neck at surgical site. He has  had a course of augmentin and a course of doxycycline. -was given course of vanc initially -Area is not erythematous, indurated, or draining   Chronic pain syndrome Patient with trigeminal and occipatl neuralgia followed by Dr. Maryruth Eve. Has had implantable stimulators including left forehead and posterior neck. -In setting of renal failure, avoid morphine -Recs for hydromorphone, fentanyl, and methadone for pain control -Gabapentin renally dosed   Depression with anxiety Long standing problem of depression and anxiety related to chronic pain syndrome - Continued Klonopin and SSRI   HTN (hypertension) -BP noted to be low during this visit -BP meds were stopped -Cont to hold Zestril in light of acute renal failure        Consultants: Cardiology, Nephrology Procedures performed:   Disposition: Left AMA  DISCHARGE MEDICATION: Allergies as of 12/24/2021       Reactions   Suboxone  [buprenorphine Hcl-naloxone Hcl] Other (See Comments), Shortness Of Breath   Mirtazapine Other (See Comments)   Other reaction(s): Other        Medication List     STOP taking these medications    amLODipine 10 MG tablet Commonly known as: NORVASC   cloNIDine 0.2 MG tablet Commonly known as: CATAPRES   gabapentin 800 MG tablet Commonly known as: NEURONTIN Replaced by: gabapentin 300 MG capsule   lisinopril 20 MG tablet Commonly known as: ZESTRIL   morphine 15 MG tablet Commonly known as: MSIR   morphine 30 MG 12 hr tablet Commonly known as: MS CONTIN       TAKE these medications    aspirin EC 81 MG tablet Take 1 tablet (81 mg total) by mouth daily. Swallow whole. Start taking on: December 25, 2021   azithromycin 250 MG tablet Commonly known as: ZITHROMAX Take 1 tablet (250 mg total) by mouth daily for 2 days.   cefdinir 300 MG capsule Commonly known as: OMNICEF Take 1 capsule (300 mg total) by mouth 2 (two) times daily for 2 days.   clonazePAM 2 MG tablet Commonly known  as: KLONOPIN Take 2 mg by mouth 2 (two) times daily as needed for anxiety.   gabapentin 300 MG capsule Commonly known as: NEURONTIN Take 1 capsule (300 mg total) by mouth 3 (three) times daily for 7 days. Replaces: gabapentin 800 MG tablet   PARoxetine 30 MG tablet Commonly known as: PAXIL Take 30 mg by mouth daily.   rosuvastatin 10 MG tablet Commonly known as: CRESTOR Take 1 tablet (10 mg total) by mouth daily. Start taking on: December 25, 2021   tiZANidine 4 MG tablet Commonly known as: ZANAFLEX Take 4 mg by mouth every 12 (twelve) hours as needed.   VITAMIN B-12 PO Take 1 tablet by mouth daily.        Follow-up Information     Follow up with your PCP Follow up.          Follow up with heart cath Follow up.                 Discharge Exam: Filed Weights   12/20/21 1126 12/23/21 0535 12/24/21 0432  Weight: 98.8 kg 88 kg 91.3  kg   General exam: Awake, laying in bed, in nad Respiratory system: Normal respiratory effort, no wheezing Cardiovascular system: regular rate, s1, s2 Gastrointestinal system: Soft, nondistended, positive BS Central nervous system: CN2-12 grossly intact, strength intact Extremities: Perfused, no clubbing Skin: Normal skin turgor, no notable skin lesions seen Psychiatry: Mood normal // no visual hallucinations    Condition at discharge: serious  The results of significant diagnostics from this hospitalization (including imaging, microbiology, ancillary and laboratory) are listed below for reference.   Imaging Studies: ECHOCARDIOGRAM COMPLETE  Result Date: 12/22/2021    ECHOCARDIOGRAM REPORT   Patient Name:   Barlow Fread Date of Exam: 12/22/2021 Medical Rec #:  [id number]     Height:       67.0 in Accession #:    [id number]    Weight:       217.8 lb Date of Birth:  Mar 07, 1956     BSA:          2.097 m Patient Age:    66 years      BP:           126/57 mmHg Patient Gender: M             HR:           85 bpm. Exam Location:  Inpatient  Procedure: 2D Echo Indications:    NSTEMI  History:        Patient has no prior history of Echocardiogram examinations.                 Signs/Symptoms:Altered Mental Status; Risk Factors:Hypertension.  Sonographer:    Johny Chess RDCS Referring Phys: [place]  Sonographer Comments: Suboptimal subcostal window. IMPRESSIONS  1. Left ventricular ejection fraction, by estimation, is 55 to 60%. The left ventricle has normal function. The left ventricle demonstrates regional wall motion abnormalities (see scoring diagram/findings for description). Left ventricular diastolic parameters are indeterminate. There is akinesis of the left ventricular, basal inferior wall.  2. Right ventricular systolic function is severely reduced. The right ventricular size is moderately enlarged. Tricuspid regurgitation signal is inadequate for assessing PA pressure.  3. The mitral valve is normal in structure. No evidence of mitral valve regurgitation. No evidence of mitral stenosis.  4. The aortic valve is tricuspid. Aortic valve regurgitation is trivial. Aortic valve sclerosis/calcification is present, without any evidence of aortic stenosis. FINDINGS  Left Ventricle: Left ventricular ejection fraction, by estimation, is 55 to 60%. The left ventricle has normal function. The left ventricle demonstrates regional wall motion abnormalities. The left ventricular internal cavity size was normal in size. There is no left ventricular hypertrophy. Left ventricular diastolic parameters are indeterminate. Normal left ventricular filling pressure. Right Ventricle: The right ventricular size is moderately enlarged. No increase in right ventricular wall thickness. Right ventricular systolic function is severely reduced. Tricuspid regurgitation signal is inadequate for assessing PA pressure. Left [facility]: Left atrial size was normal in size. Right [facility]: Right atrial size was normal in size. Pericardium: There is no evidence of  pericardial effusion. Mitral Valve: The mitral valve is normal in structure. No evidence of mitral valve regurgitation. No evidence of mitral valve stenosis. Tricuspid Valve: The tricuspid valve is normal in structure. Tricuspid valve regurgitation is trivial. No evidence of tricuspid stenosis. Aortic Valve: The aortic valve is tricuspid. Aortic valve regurgitation is trivial. Aortic valve sclerosis/calcification is present, without any evidence of aortic stenosis. Pulmonic Valve: The pulmonic valve was normal in structure. Pulmonic valve regurgitation is trivial.  No evidence of pulmonic stenosis. Aorta: The aortic root is normal in size and structure. Venous: The inferior vena cava was not well visualized. IAS/Shunts: No atrial level shunt detected by color flow Doppler.  LEFT VENTRICLE PLAX 2D LVIDd:         4.70 cm   Diastology LVIDs:         3.40 cm   LV e' medial:    5.98 cm/s LV PW:         1.10 cm   LV E/e' medial:  13.1 LV IVS:        1.00 cm   LV e' lateral:   9.14 cm/s LVOT diam:     2.40 cm   LV E/e' lateral: 8.5 LV SV:         67 LV SV Index:   32 LVOT Area:     4.52 cm  RIGHT VENTRICLE RV S prime:     10.80 cm/s TAPSE (M-mode): 1.1 cm LEFT ATRIUM             Index        RIGHT ATRIUM           Index LA diam:        4.10 cm 1.96 cm/m   RA Area:     11.90 cm LA Vol (A2C):   62.8 ml 29.95 ml/m  RA Volume:   24.80 ml  11.83 ml/m LA Vol (A4C):   42.8 ml 20.41 ml/m LA Biplane Vol: 52.3 ml 24.94 ml/m  AORTIC VALVE LVOT Vmax:   79.50 cm/s LVOT Vmean:  54.800 cm/s LVOT VTI:    0.147 m  AORTA Ao Root diam: 3.10 cm Ao Asc diam:  2.90 cm MITRAL VALVE MV Area (PHT): 3.48 cm    SHUNTS MV Decel Time: 218 msec    Systemic VTI:  0.15 m MV E velocity: 78.10 cm/s  Systemic Diam: 2.40 cm MV A velocity: 71.80 cm/s MV E/A ratio:  1.09 Fransico Him MD Electronically signed by Fransico Him MD Signature Date/Time: 12/22/2021/11:02:46 AM    Final    DG Chest 2 View  Result Date: 12/21/2021 CLINICAL DATA:  Encounter  for CHF EXAM: CHEST - 2 VIEW COMPARISON:  Two days ago FINDINGS: Worsening diffuse hazy opacification of the chest, compatible with history of CHF. Borderline cardiomegaly and vascular pedicle widening. No visible effusion or pneumothorax. Artifact from EKG leads an neural stimulators. IMPRESSION: Worsening airspace disease. Electronically Signed   By: Jorje Guild M.D.   On: 12/21/2021 05:37   DG Chest Port 1 View  Result Date: 12/19/2021 CLINICAL DATA:  Short of breath, stroke EXAM: PORTABLE CHEST 1 VIEW COMPARISON:  08/19/2019 FINDINGS: Single frontal view of the chest demonstrates stable catheter tubing overlying left chest. Cardiac silhouette is unremarkable. There is increased central vascular congestion, with developing bilateral perihilar airspace disease right greater than left. No effusion or pneumothorax. No acute bony abnormalities. IMPRESSION: 1. Findings consistent with mild congestive heart failure. Electronically Signed   By: Randa Ngo M.D.   On: 12/19/2021 21:49   US RENAL  Result Date: 12/19/2021 CLINICAL DATA:  [id number].  Renal failure. EXAM: RENAL / URINARY TRACT ULTRASOUND COMPLETE COMPARISON:  None Available. FINDINGS: Right Kidney: Renal measurements: 10.7 x 4.7 x 5.1 cm = volume: 134 mL. Echogenicity within normal limits. No mass or hydronephrosis visualized. Left Kidney: Renal measurements: 10.2 x 6.4 x 5 cm = volume: 171 mL. Echogenicity within normal limits. No mass or hydronephrosis visualized. Urinary bladder: Appears  normal for degree of bladder distention. Other: None. IMPRESSION: Unremarkable renal ultrasound. Electronically Signed   By: Iven Finn M.D.   On: 12/19/2021 21:27   CT Cervical Spine Wo Contrast  Result Date: 12/19/2021 CLINICAL DATA:  Found down EXAM: CT CERVICAL SPINE WITHOUT CONTRAST TECHNIQUE: Multidetector CT imaging of the cervical spine was performed without intravenous contrast. Multiplanar CT image reconstructions were also generated. RADIATION  DOSE REDUCTION: This exam was performed according to the departmental dose-optimization program which includes automated exposure control, adjustment of the mA and/or kV according to patient size and/or use of iterative reconstruction technique. COMPARISON:  01/21/2015 FINDINGS: Alignment: Alignment is grossly anatomic. Skull base and vertebrae: No acute fracture. No primary bone lesion or focal pathologic process. Soft tissues and spinal canal: No prevertebral fluid or swelling. No visible canal hematoma. Disc levels: Stable C5-6 ACDF. Bony fusion at C6-7 unchanged. Severe spondylosis at C3-4 results in significant bony encroachment upon the central canal, stable. Moderate C4-5 and C7-T1 spondylosis unchanged. Upper chest: Airway is patent. There is biapical ground-glass airspace disease. Other: Reconstructed images demonstrate no additional findings. IMPRESSION: 1. No acute cervical spine fracture. 2. Multilevel cervical spondylosis greatest at C3-4, stable. 3. Biapical ground-glass airspace disease which may reflect edema, aspiration, or infection. Electronically Signed   By: Randa Ngo M.D.   On: 12/19/2021 20:43   CT HEAD CODE STROKE WO CONTRAST  Result Date: 12/19/2021 CLINICAL DATA:  Code stroke. Initial evaluation for neuro deficit, stroke. EXAM: CT HEAD WITHOUT CONTRAST TECHNIQUE: Contiguous axial images were obtained from the base of the skull through the vertex without intravenous contrast. RADIATION DOSE REDUCTION: This exam was performed according to the departmental dose-optimization program which includes automated exposure control, adjustment of the mA and/or kV according to patient size and/or use of iterative reconstruction technique. COMPARISON:  Prior CT from 01/21/2015. FINDINGS: Brain: Age-related cerebral atrophy with mild chronic microvascular ischemic disease. No acute intracranial hemorrhage. No acute large vessel territory infarct. No mass lesion, midline shift or mass effect. No  hydrocephalus or extra-axial fluid collection. Vascular: No abnormal hyperdense vessel. Scattered vascular calcifications noted within the carotid siphons. Skull: Scalp soft tissues and calvarium demonstrate no acute finding. Generator with electrode present within the left scalp and neck. Sinuses/Orbits: Globes and orbital soft tissues demonstrate no acute finding. Paranasal sinuses are largely clear. No mastoid effusion. Other: None. ASPECTS [facility] Stroke Program Early CT Score) - Ganglionic level infarction (caudate, lentiform nuclei, internal capsule, insula, M1-M3 cortex): 7 - Supraganglionic infarction (M4-M6 cortex): 3 Total score (0-10 with 10 being normal): 10 IMPRESSION: 1. No acute intracranial abnormality. 2. ASPECTS is 10. These results were communicated to Dr. Rory Percy at 8:41 pm on 12/19/2021 by text page via the [facility] messaging system. Electronically Signed   By: Jeannine Boga M.D.   On: 12/19/2021 20:43    Microbiology: Results for orders placed or performed during the hospital encounter of 12/19/21  Urine Culture     Status: None   Collection Time: 12/20/21 12:08 PM   Specimen: Urine, Clean Catch  Result Value Ref Range Status   Specimen Description URINE, CLEAN CATCH  Final   Special Requests NONE  Final   Culture   Final    NO GROWTH Performed at [facility], [place] [address], [place], [place] [address]    Report Status 12/21/2021 FINAL  Final  Culture, blood (Routine X 2) w Reflex to ID Panel     Status: None (Preliminary result)   Collection Time: 12/20/21  12:09 PM   Specimen: BLOOD RIGHT WRIST  Result Value Ref Range Status   Specimen Description BLOOD RIGHT WRIST  Final   Special Requests   Final    BOTTLES DRAWN AEROBIC AND ANAEROBIC Blood Culture adequate volume   Culture   Final    NO GROWTH 4 DAYS Performed at [facility], [address], [place], [place] [address]    Report Status PENDING  Incomplete  Culture, blood (Routine X 2) w Reflex to ID  Panel     Status: None (Preliminary result)   Collection Time: 12/20/21  4:33 PM   Specimen: BLOOD  Result Value Ref Range Status   Specimen Description BLOOD RIGHT ANTECUBITAL  Final   Special Requests   Final    BOTTLES DRAWN AEROBIC AND ANAEROBIC Blood Culture adequate volume   Culture   Final    NO GROWTH 4 DAYS Performed at [facility], [place] [address], [place], [place] [address]    Report Status PENDING  Incomplete    Labs: CBC: Recent Labs  Lab 12/19/21 2034 12/19/21 2040 12/20/21 0227 12/20/21 0651 12/21/21 0604 12/22/21 0335 12/23/21 0544 12/24/21 1001  WBC 14.2*  --  15.7*  --  14.5* 12.1* 11.5* 6.2  NEUTROABS 12.9*  --   --   --   --   --   --   --   HGB 10.8*   < > 10.5* 10.2* 10.0* 10.1* 10.5* 11.5*  HCT 32.8*   < > 31.7* 30.0* 29.8* 29.7* 30.3* 32.9*  MCV 98.5  --  96.9  --  96.1 93.4 91.5 92.2  PLT 366  --  336  --  252 256 283 112*   < > = values in this interval not displayed.   Basic Metabolic Panel: Recent Labs  Lab 12/20/21 0227 12/20/21 0651 12/21/21 0604 12/22/21 0335 12/23/21 0544 12/24/21 1001  NA 137 133* 136 137 138 138  K 4.6 4.7 4.1 3.7 4.0 3.4*  CL 100  --  100 97* 96* 98  CO2 24  --  '25 28 30 26  '$ GLUCOSE 128*  --  129* 116* 103* 150*  BUN 57*  --  66* 61* 64* 57*  CREATININE 4.58*  --  4.61* 4.07* 3.67* 2.90*  CALCIUM 8.2*  --  8.1* 8.3* 8.8* 8.9   Liver Function Tests: Recent Labs  Lab 12/19/21 2034 12/22/21 0335  AST 39 34  ALT 24 26  ALKPHOS 70 62  BILITOT 0.5 0.5  PROT 6.4* 6.0*  ALBUMIN 3.5 2.9*   CBG: Recent Labs  Lab 12/19/21 2007 12/19/21 2135 12/22/21 1416  GLUCAP 149* 135* 162*    Discharge time spent: less than 30 minutes.  Signed: Marylu Lund, MD [facility] 12/24/2021

## 2021-12-24 NOTE — Progress Notes (Signed)
Patient ID: Joel Hart, male   DOB: 08-30-1955, 66 y.o.   MRN: [id number] S: Wants to go home due to his PTSD and reports that he will come back in the morning for his right heart cath. O:BP (!) 128/57 (BP Location: Right Arm)   Pulse 71   Temp 97.7 F (36.5 C) (Oral)   Resp 17   Ht '5\' 7"'$  (1.702 m)   Wt 91.3 kg   SpO2 96%   BMI 31.51 kg/m   Intake/Output Summary (Last 24 hours) at 12/24/2021 1108 Last data filed at 12/24/2021 0600 Gross per 24 hour  Intake 960 ml  Output 2850 ml  Net -1890 ml   Intake/Output: I/O last 3 completed shifts: In: 960 [P.O.:960] Out: 4250 [Urine:4250]  Intake/Output this shift:  No intake/output data recorded. Weight change: 3.266 kg Gen:NAD CVS: RRR Resp:CTA Abd: +BS, soft, NT/ND Ext: no edema  Recent Labs  Lab 12/19/21 2034 12/19/21 2040 12/19/21 2112 12/19/21 2209 12/20/21 0227 12/20/21 0651 12/21/21 0604 12/22/21 0335 12/23/21 0544  NA 133* 131*  --  131* 137 133* 136 137 138  K 6.4* 6.2* 4.9 6.1* 4.6 4.7 4.1 3.7 4.0  CL 100 101  --   --  100  --  100 97* 96*  CO2 20*  --   --   --  24  --  '25 28 30  '$ GLUCOSE 164* 156*  --   --  128*  --  129* 116* 103*  BUN 59* 70*  --   --  57*  --  66* 61* 64*  CREATININE 4.73* 5.10*  --   --  4.58*  --  4.61* 4.07* 3.67*  ALBUMIN 3.5  --   --   --   --   --   --  2.9*  --   CALCIUM 8.0*  --   --   --  8.2*  --  8.1* 8.3* 8.8*  AST 39  --   --   --   --   --   --  34  --   ALT 24  --   --   --   --   --   --  26  --    Liver Function Tests: Recent Labs  Lab 12/19/21 2034 12/22/21 0335  AST 39 34  ALT 24 26  ALKPHOS 70 62  BILITOT 0.5 0.5  PROT 6.4* 6.0*  ALBUMIN 3.5 2.9*   No results for input(s): "LIPASE", "AMYLASE" in the last 168 hours. Recent Labs  Lab 12/19/21 2039  AMMONIA 25   CBC: Recent Labs  Lab 12/19/21 2034 12/19/21 2040 12/20/21 0227 12/20/21 0651 12/21/21 0604 12/22/21 0335 12/23/21 0544  WBC 14.2*  --  15.7*  --  14.5* 12.1* 11.5*  NEUTROABS 12.9*  --    --   --   --   --   --   HGB 10.8*   < > 10.5*   < > 10.0* 10.1* 10.5*  HCT 32.8*   < > 31.7*   < > 29.8* 29.7* 30.3*  MCV 98.5  --  96.9  --  96.1 93.4 91.5  PLT 366  --  336  --  252 256 283   < > = values in this interval not displayed.   Cardiac Enzymes: Recent Labs  Lab 12/19/21 2115  CKTOTAL 275   CBG: Recent Labs  Lab 12/19/21 2007 12/19/21 2135 12/22/21 1416  GLUCAP 149* 135* 162*    Iron Studies: No  results for input(s): "IRON", "TIBC", "TRANSFERRIN", "FERRITIN" in the last 72 hours. Studies/Results: No results found.  [START ON 12/25/2021] aspirin  81 mg Oral Pre-Cath   aspirin EC  81 mg Oral Daily   azithromycin  500 mg Oral Daily   vitamin B-12  1,000 mcg Oral Daily   folic acid  1 mg Oral Daily   gabapentin  300 mg Oral TID   heparin injection (subcutaneous)  5,000 Units Subcutaneous Q8H   PARoxetine  30 mg Oral Daily   polyethylene glycol  17 g Oral Daily   rosuvastatin  10 mg Oral Daily   senna  1 tablet Oral BID   sodium chloride flush  3 mL Intravenous Once   sodium chloride flush  3 mL Intravenous Q12H    BMET    Component Value Date/Time   NA 138 12/23/2021 0544   NA 139 04/30/2013 1622   K 4.0 12/23/2021 0544   K 4.5 04/30/2013 1622   CL 96 (L) 12/23/2021 0544   CL 104 04/30/2013 1622   CO2 30 12/23/2021 0544   CO2 29 04/30/2013 1622   GLUCOSE 103 (H) 12/23/2021 0544   GLUCOSE 109 (H) 04/30/2013 1622   BUN 64 (H) 12/23/2021 0544   BUN 15 04/30/2013 1622   CREATININE 3.67 (H) 12/23/2021 0544   CREATININE 1.33 (H) 04/30/2013 1622   CALCIUM 8.8 (L) 12/23/2021 0544   CALCIUM 9.4 04/30/2013 1622   GFRNONAA 17 (L) 12/23/2021 0544   GFRNONAA 58 (L) 04/30/2013 1622   GFRAA 63 (L) 10/08/2013 1023   GFRAA >60 04/30/2013 1622   CBC    Component Value Date/Time   WBC 11.5 (H) 12/23/2021 0544   RBC 3.31 (L) 12/23/2021 0544   HGB 10.5 (L) 12/23/2021 0544   HCT 30.3 (L) 12/23/2021 0544   PLT 283 12/23/2021 0544   MCV 91.5 12/23/2021 0544    MCH 31.7 12/23/2021 0544   MCHC 34.7 12/23/2021 0544   RDW 12.7 12/23/2021 0544   LYMPHSABS 0.6 (L) 12/19/2021 2034   MONOABS 0.7 12/19/2021 2034   EOSABS 0.0 12/19/2021 2034   BASOSABS 0.0 12/19/2021 2034    Assessment/Plan:  AKI - unclear etiology.  Renal US without obstruction or abnormalities.  UA with moderate blood but following catheter placement.  UNa <10, FeNa 0.19% consistent with pre-renal causes and may be dehydrated due to AMS for the past week.  His elevated BNP and CXR support pulmonary edema.  Will start acute GN workup which can also have low FeNa.  Given his significant UOP, low FeNa, low BP, and RV dysfunction will continue to hold further IV lasix for now and follow.  BUN/Cr already improving.  No indication for dialysis at this time.  Will continue to follow closely.  Avoid nephrotoxic medications including NSAIDs and iodinated intravenous contrast exposure unless the latter is absolutely indicated. Preferred narcotic agents for pain control are hydromorphone, fentanyl, and methadone. Morphine should not be used.  Avoid Baclofen and avoid oral sodium phosphate and magnesium citrate based laxatives / bowel preps.  Continue strict Input and Output monitoring.  Will monitor the patient closely with you and intervene or adjust therapy as indicated by changes in clinical status/labs  Acute metabolic encephalopathy - negative for CVA and may be related to pain meds as well as AKI.  Improving.   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  Preserved EF. Diuretics on hold.  W/u per Cardiology and Heart failure team NSTEMI - Cardiology following Pulmonary edema -  mild, seen on CXR with rising BNP.  Would also recommend repeat imaging to r/o pneumonia give elevated WBC and AMS.  PNA would also increase BNP.  He has received 3 doses of IV lasix at 40 mg and produced almost 1500 mL today.  Lasix held for now as above given low FeNa and AKI.  No peripheral edema.  diuresed 2.6  liters overnight but CXR this morning is worsening diffuse hazy opacification.  I am concerned that this may actually be a pneumonia.  Awaiting ECHO, he refused yesterday.  Cardiology following.  I did decrease furosemide to 40 mg daily given vigorous diuresis and reduced RV function.  Hyperkalemia - improved with medical therapy and lasix. Hyponatremia - improving Anemia - new, will continue to follow.  Will order SPEP/UPEP and iron stores. Disposition - wants to leave today AMA.  Discussed risks, however he remains determined to leave and said he would come back tomorrow morning for his right heart cath.   Donetta Potts, MD [facility]

## 2021-12-24 NOTE — Discharge Instructions (Signed)
Return tomorrow for heart cath scheduled for 3 P.m. 8/14. Return at 1300 8/14 to short stay

## 2021-12-25 ENCOUNTER — Ambulatory Visit (HOSPITAL_COMMUNITY)
Admission: AD | Admit: 2021-12-25 | Discharge: 2021-12-25 | Disposition: A | Discharge status: Home / Self Care | Payer: Medicare Other | Source: Ambulatory Visit | Attending: Cardiology | Admitting: Cardiology

## 2021-12-25 ENCOUNTER — Other Ambulatory Visit: Payer: Self-pay

## 2021-12-25 ENCOUNTER — Encounter (HOSPITAL_COMMUNITY)
Admission: AD | Disposition: A | Discharge status: Home / Self Care | Payer: Self-pay | Source: Ambulatory Visit | Attending: Cardiology

## 2021-12-25 DIAGNOSIS — N179 Acute kidney failure, unspecified: Secondary | ICD-10-CM | POA: Diagnosis not present

## 2021-12-25 DIAGNOSIS — I509 Heart failure, unspecified: Secondary | ICD-10-CM | POA: Diagnosis not present

## 2021-12-25 DIAGNOSIS — R451 Restlessness and agitation: Secondary | ICD-10-CM | POA: Insufficient documentation

## 2021-12-25 DIAGNOSIS — I5033 Acute on chronic diastolic (congestive) heart failure: Secondary | ICD-10-CM | POA: Diagnosis present

## 2021-12-25 HISTORY — PX: RIGHT HEART CATH: CATH118263

## 2021-12-25 LAB — CULTURE, BLOOD (ROUTINE X 2)
Culture: NO GROWTH
Culture: NO GROWTH
Special Requests: ADEQUATE
Special Requests: ADEQUATE

## 2021-12-25 LAB — POCT I-STAT EG7
Acid-Base Excess: 4 mmol/L — ABNORMAL HIGH (ref 0.0–2.0)
Bicarbonate: 28.7 mmol/L — ABNORMAL HIGH (ref 20.0–28.0)
Calcium, Ion: 1.15 mmol/L (ref 1.15–1.40)
HCT: 28 % — ABNORMAL LOW (ref 39.0–52.0)
Hemoglobin: 9.5 g/dL — ABNORMAL LOW (ref 13.0–17.0)
O2 Saturation: 59 %
Potassium: 3.5 mmol/L (ref 3.5–5.1)
Sodium: 139 mmol/L (ref 135–145)
TCO2: 30 mmol/L (ref 22–32)
pCO2, Ven: 41.9 mmHg — ABNORMAL LOW (ref 44–60)
pH, Ven: 7.444 — ABNORMAL HIGH (ref 7.25–7.43)
pO2, Ven: 30 mmHg — CL (ref 32–45)

## 2021-12-25 LAB — PROTEIN ELECTROPHORESIS, SERUM
A/G Ratio: 1.3 (ref 0.7–1.7)
Albumin ELP: 3.3 g/dL (ref 2.9–4.4)
Alpha-1-Globulin: 0.3 g/dL (ref 0.0–0.4)
Alpha-2-Globulin: 0.8 g/dL (ref 0.4–1.0)
Beta Globulin: 0.9 g/dL (ref 0.7–1.3)
Gamma Globulin: 0.6 g/dL (ref 0.4–1.8)
Globulin, Total: 2.6 g/dL (ref 2.2–3.9)
M-Spike, %: 0.2 g/dL — ABNORMAL HIGH
Total Protein ELP: 5.9 g/dL — ABNORMAL LOW (ref 6.0–8.5)

## 2021-12-25 SURGERY — RIGHT HEART CATH
Anesthesia: LOCAL

## 2021-12-25 MED ORDER — LABETALOL HCL 5 MG/ML IV SOLN: 10.0000 mg | INTRAVENOUS | Status: DC | PRN

## 2021-12-25 MED ORDER — ASPIRIN 81 MG PO CHEW: 81.0000 mg | CHEWABLE_TABLET | ORAL | Status: AC

## 2021-12-25 MED ORDER — SODIUM CHLORIDE 0.9% FLUSH: 3.0000 mL | INTRAVENOUS | Status: DC | PRN

## 2021-12-25 MED ORDER — SODIUM CHLORIDE 0.9% FLUSH
3.0000 mL | Freq: Two times a day (BID) | INTRAVENOUS | Status: DC
Start: 2021-12-25 — End: 2021-12-25

## 2021-12-25 MED ORDER — ACETAMINOPHEN 325 MG PO TABS: 650.0000 mg | ORAL_TABLET | ORAL | Status: DC | PRN

## 2021-12-25 MED ORDER — ONDANSETRON HCL 4 MG/2ML IJ SOLN: 4.0000 mg | Freq: Four times a day (QID) | INTRAMUSCULAR | Status: DC | PRN

## 2021-12-25 MED ORDER — ASPIRIN 81 MG PO CHEW
CHEWABLE_TABLET | ORAL | Status: AC
  Administered 2021-12-25: 81 mg via ORAL
  Filled 2021-12-25: qty 1

## 2021-12-25 MED ORDER — SODIUM CHLORIDE 0.9% FLUSH: 3.0000 mL | Freq: Two times a day (BID) | INTRAVENOUS | Status: DC

## 2021-12-25 MED ORDER — HEPARIN (PORCINE) IN NACL 1000-0.9 UT/500ML-% IV SOLN
INTRAVENOUS | Status: AC
  Filled 2021-12-25: qty 500

## 2021-12-25 MED ORDER — SODIUM CHLORIDE 0.9 % IV SOLN: 250.0000 mL | INTRAVENOUS | Status: DC | PRN

## 2021-12-25 MED ORDER — HYDRALAZINE HCL 20 MG/ML IJ SOLN: 10.0000 mg | INTRAMUSCULAR | Status: DC | PRN

## 2021-12-25 MED ORDER — HEPARIN (PORCINE) IN NACL 1000-0.9 UT/500ML-% IV SOLN
INTRAVENOUS | Status: DC | PRN
  Administered 2021-12-25: 500 mL

## 2021-12-25 MED ORDER — LIDOCAINE HCL (PF) 1 % IJ SOLN
INTRAMUSCULAR | Status: AC
  Filled 2021-12-25: qty 30

## 2021-12-25 MED ORDER — LIDOCAINE HCL (PF) 1 % IJ SOLN
INTRAMUSCULAR | Status: DC | PRN
  Administered 2021-12-25: 2 mL

## 2021-12-25 MED ORDER — SODIUM CHLORIDE 0.9 % IV SOLN: INTRAVENOUS | Status: DC

## 2021-12-25 SURGICAL SUPPLY — 11 items
CATH BALLN WEDGE 5F 110CM (CATHETERS) ×1 IMPLANT
CATH INFINITI 5 FR MPA2 (CATHETERS) ×1 IMPLANT
GUIDEWIRE .025 260CM (WIRE) ×1 IMPLANT
PACK CARDIAC CATHETERIZATION (CUSTOM PROCEDURE TRAY) ×2 IMPLANT
PROTECTION STATION PRESSURIZED (MISCELLANEOUS) ×2
SHEATH GLIDE SLENDER 4/5FR (SHEATH) ×1 IMPLANT
STATION PROTECTION PRESSURIZED (MISCELLANEOUS) IMPLANT
TRANSDUCER W/STOPCOCK (MISCELLANEOUS) ×2 IMPLANT
TUBING ART PRESS 72  MALE/FEM (TUBING) ×2
TUBING ART PRESS 72 MALE/FEM (TUBING) IMPLANT
WIRE EMERALD 3MM-J .025X260CM (WIRE) ×1 IMPLANT

## 2021-12-25 NOTE — Progress Notes (Signed)
Pt is dressing himself-states he is going to sit outside to wait for his ride-refused to stay in [facility] to wait for ride-pt did not receive sedation-NAD-steady gait

## 2021-12-25 NOTE — Interval H&P Note (Signed)
History and Physical Interval Note:  12/25/2021 2:11 PM  Joel Hart  has presented today for surgery, with the diagnosis of heart failure.  The various methods of treatment have been discussed with the patient and family. After consideration of risks, benefits and other options for treatment, the patient has consented to  Procedure(s): RIGHT HEART CATH (N/A) as a surgical intervention.  The patient's history has been reviewed, patient examined, no change in status, stable for surgery.  I have reviewed the patient's chart and labs.  Questions were answered to the patient's satisfaction.     Erastus Bartolomei [employer]

## 2021-12-26 ENCOUNTER — Encounter (HOSPITAL_COMMUNITY): Payer: Self-pay | Admitting: Cardiology

## 2022-01-07 NOTE — Progress Notes (Deleted)
Office Visit    Patient Name: Joel Hart Date of Encounter: 01/07/2022  PCP:  Kathyrn Lass   [place]  Cardiologist:  None *** Advanced Practice Provider:  No care team member to display Electrophysiologist:  None  {Press F2 to show EP APP, CHF, sleep or structural heart MD               :[id number]}  { Click here to update then REFRESH NOTE - MD (PCP) or APP (Team Member)  Change PCP Type for MD, Specialty for APP is either Cardiology or Clinical Cardiac Electrophysiology  :[id number]}  Chief Complaint    Joel Hart is a 66 y.o. male presents today for ***   Past Medical History    Past Medical History:  Diagnosis Date   Anxiety    Arthritis    Cancer ([place])    testicular   Depression    GERD (gastroesophageal reflux disease)    High triglycerides 12/19/2021   Hyperlipidemia    Hypertension    Sleep apnea    uses CPAP   Trigeminus neuralgia    Past Surgical History:  Procedure Laterality Date   BACK SURGERY     BRAIN SURGERY     times 2   CERVICAL FUSION     COLON SURGERY     from parasite    RIGHT HEART CATH N/A 12/25/2021   Procedure: RIGHT HEART CATH;  Surgeon: Larey Dresser, MD;  Location: [place] [facility];  Service: Cardiovascular;  Laterality: N/A;   SPINAL CORD STIMULATOR INSERTION N/A 10/13/2013   Procedure: Supraorbital Peripheral nerve stimulator with Dr. Vertell Limber assisting;  Surgeon: Bonna Gains, MD;  Location: [facility];  Service: Neurosurgery;  Laterality: N/A;   SURGERY SCROTAL / TESTICULAR      Allergies  Allergies  Allergen Reactions   Suboxone  [Buprenorphine Hcl-Naloxone Hcl] Other (See Comments) and Shortness Of Breath   Mirtazapine Other (See Comments)    Other reaction(s): Other    History of Present Illness    [name] is a 66 y.o. male with a hx of *** last seen ***.   EKGs/Labs/Other Studies Reviewed:   The following studies were reviewed today: ***  EKG:  EKG is *** ordered today.  The  ekg ordered today demonstrates ***  Recent Labs: 12/20/2021: B Natriuretic Peptide 1,543.9 12/22/2021: ALT 26 12/24/2021: BUN 57; Creatinine, Ser 2.90; Platelets 112 12/25/2021: Hemoglobin 9.5; Potassium 3.5; Sodium 139  Recent Lipid Panel    Component Value Date/Time   CHOL 204 (H) 03/30/2019 1156   TRIG 262.0 (H) 03/30/2019 1156   HDL 51.10 03/30/2019 1156   CHOLHDL 4 03/30/2019 1156   VLDL 52.4 (H) 03/30/2019 1156   LDLDIRECT 121.0 03/30/2019 1156    Risk Assessment/Calculations:  {Does this patient have ATRIAL FIBRILLATION?:[phone]}  Home Medications   No outpatient medications have been marked as taking for the 01/08/22 encounter (Appointment) with Elgie Collard, PA-C.     Review of Systems   ***   All other systems reviewed and are otherwise negative except as noted above.  Physical Exam    VS:  There were no vitals taken for this visit. , BMI There is no height or weight on file to calculate BMI.  Wt Readings from Last 3 Encounters:  12/25/21 196 lb (88.9 kg)  12/24/21 201 lb 3.2 oz (91.3 kg)  08/16/21 205 lb (93 kg)     GEN: Well nourished, well developed, in no acute  distress. HEENT: normal. Neck: Supple, no JVD, carotid bruits, or masses. Cardiac: ***RRR, no murmurs, rubs, or gallops. No clubbing, cyanosis, edema.  ***Radials/PT 2+ and equal bilaterally.  Respiratory:  ***Respirations regular and unlabored, clear to auscultation bilaterally. GI: Soft, nontender, nondistended. MS: No deformity or atrophy. Skin: Warm and dry, no rash. Neuro:  Strength and sensation are intact. Psych: Normal affect.  Assessment & Plan    ***  No BP recorded.  {Refresh Note OR Click here to enter BP  :1}***      Disposition: Follow up {follow up:15908} with None or APP.  Signed, Elgie Collard, PA-C 01/07/2022, 1:14 PM [facility]

## 2022-01-08 ENCOUNTER — Ambulatory Visit: Payer: Medicare Other | Admitting: Physician Assistant
